# Patient Record
Sex: Male | Born: 1950 | Race: White | Hispanic: No | Marital: Married | Smoking: Former smoker
Health system: Southern US, Community
[De-identification: ages and names within clinical notes are randomized; demographics above are authoritative.]

## PROBLEM LIST (undated history)

## (undated) DIAGNOSIS — M5030 Other cervical disc degeneration, unspecified cervical region: Secondary | ICD-10-CM

## (undated) DIAGNOSIS — I2510 Atherosclerotic heart disease of native coronary artery without angina pectoris: Secondary | ICD-10-CM

## (undated) DIAGNOSIS — E785 Hyperlipidemia, unspecified: Secondary | ICD-10-CM

## (undated) DIAGNOSIS — H8110 Benign paroxysmal vertigo, unspecified ear: Secondary | ICD-10-CM

## (undated) DIAGNOSIS — K219 Gastro-esophageal reflux disease without esophagitis: Secondary | ICD-10-CM

## (undated) DIAGNOSIS — H9319 Tinnitus, unspecified ear: Secondary | ICD-10-CM

## (undated) HISTORY — DX: Atherosclerotic heart disease of native coronary artery without angina pectoris: I25.10

## (undated) HISTORY — DX: Hyperlipidemia, unspecified: E78.5

## (undated) HISTORY — DX: Benign paroxysmal vertigo, unspecified ear: H81.10

## (undated) HISTORY — DX: Other cervical disc degeneration, unspecified cervical region: M50.30

## (undated) HISTORY — DX: Tinnitus, unspecified ear: H93.19

## (undated) HISTORY — DX: Gastro-esophageal reflux disease without esophagitis: K21.9

## (undated) HISTORY — PX: CARDIAC CATHETERIZATION: SHX172

---

## 2002-06-20 ENCOUNTER — Encounter: Payer: Self-pay | Admitting: Otolaryngology

## 2002-06-20 ENCOUNTER — Encounter: Admission: RE | Admit: 2002-06-20 | Discharge: 2002-06-20 | Payer: Self-pay | Admitting: Otolaryngology

## 2005-07-16 ENCOUNTER — Encounter: Admission: RE | Admit: 2005-07-16 | Discharge: 2005-07-16 | Payer: Self-pay | Admitting: Family Medicine

## 2009-03-24 ENCOUNTER — Inpatient Hospital Stay (HOSPITAL_BASED_OUTPATIENT_CLINIC_OR_DEPARTMENT_OTHER): Admission: RE | Admit: 2009-03-24 | Discharge: 2009-03-24 | Payer: Self-pay | Admitting: Cardiology

## 2010-01-29 ENCOUNTER — Emergency Department (HOSPITAL_COMMUNITY): Admission: EM | Admit: 2010-01-29 | Discharge: 2010-01-29 | Payer: Self-pay | Admitting: Emergency Medicine

## 2010-10-11 HISTORY — PX: OTHER SURGICAL HISTORY: SHX169

## 2011-02-23 NOTE — Cardiovascular Report (Signed)
NAMEJAYMIN, [name] NO.:  [id number]   MEDICAL RECORD NO.:  [id number]          PATIENT TYPE:  OIB   LOCATION:  1963                         FACILITY:  [facility]   PHYSICIAN:  Armanda Magic, M.D.     DATE OF BIRTH:  Oct 19, 1950   DATE OF PROCEDURE:  03/24/2009  DATE OF DISCHARGE:  03/24/2009                            CARDIAC CATHETERIZATION   REFERRING PHYSICIAN:  Joycelyn Rua, MD   PROCEDURE:  Left heart catheterization, coronary angiography, and left  ventriculography.   OPERATOR:  Armanda Magic, MD   INDICATIONS:  Shortness of breath and abnormal EKG with abnormal nuclear  stress testing which showed a positive EKG for ischemia and a fixed  defect in the inferior wall, possibly due to diaphragmatic attenuation  since this was a fixed defect and there was no evidence of ischemia.  Because of shortness of breath and markedly abnormal EKG with stress, he  presents for cardiac catheterization.   The patient was brought to Cardiac Catheterization Laboratory in a  fasting nonsedated state.  Informed consent was obtained.  The patient  was connected to continuous heart rate, pulse oximetry monitoring, and  intermittent blood pressure monitoring.  The right groin was prepped and  draped in the sterile fashion.  Xylocaine 1% was used for local  anesthesia.  Using modified Seldinger technique, a 4-French sheath was  placed in the right femoral artery.  Under fluoroscopic guidance, a 4-  [place] JL-4 catheter was placed in the left coronary artery.  Multiple  cine films were taken at 30-degree RAO and 40-degree LAO views.  This  catheter was then exchanged out over a guidewire for a 4-French 3-DRC  catheter and was successfully engaged the right coronary ostium.  Multiple cine films were taken at 30-degree RAO and 40-degree LAO views.  This catheter was then exchanged out over a guidewire for a 4-French  angled pigtail catheter and was placed under fluoroscopic guidance  in  the left ventricular cavity.  Left ventriculography was performed in the  30-degree RAO view using a total of 30 mL contrast at 15 mL per second.  The catheter was then pulled back across the aortic valve with no  significant gradient noted.  At the end of the procedure, all catheters  and sheaths were removed.  Manual compression was performed until  adequate hemostasis was obtained.  The patient was transferred back to  operating room in stable condition.   RESULTS:  Left main coronary artery is widely patent and bifurcates to  left anterior descending artery and left circumflex artery.  The left  anterior descending artery is widely patent throughout its course of the  apex.  It gives rise to a very small first diagonal branch which is  patent and a second moderate-sized diagonal 2 which is patent as well.   The left circumflex is widely patent throughout its course in the AV  groove.  It gives rise to a very large obtuse marginal 1 branch which  appears to have a 50-60% narrowing in the ostium.  It then bifurcates  distally in the 2  daughter vessels both of which are widely patent.  The  ongoing left circumflex is widely patent giving rise to a small second  obtuse marginal branch which is widely patent.   The right coronary artery is a large vessel and is widely patent  throughout its course distally.  It bifurcates into posterior descending  artery and posterolateral artery both of which are widely patent.   Left ventriculography shows normal LV function, EF 60%, LV pressure  144/20 mmHg, aortic pressure 139/74 mmHg, LVEDP 23 mmHg.   ASSESSMENT:  1. One-vessel obstructive coronary artery disease of the first obtuse      marginal artery.1  2. Normal left ventricular function, ejection fraction 60%.  3. False positive stress test by EKG criteria.  4. Elevated left ventricular end-diastolic pressure consistent with      diastolic dysfunction.   PLAN:  Start aspirin 325 mg  daily.  We will discharge him to home after  IV fluid and bedrest is complete.  He will follow up with my nurse  practitioner in 2 weeks for groin check.      Armanda Magic, M.D.  Electronically Signed     TT/MEDQ  D:  03/24/2009  T:  03/25/2009  Job:  [id number]

## 2011-06-09 ENCOUNTER — Inpatient Hospital Stay (HOSPITAL_COMMUNITY)
Admission: EM | Admit: 2011-06-09 | Discharge: 2011-06-11 | DRG: 125 | Disposition: A | Discharge status: Home / Self Care | Payer: BC Managed Care – PPO | Attending: Cardiology | Admitting: Cardiology

## 2011-06-09 ENCOUNTER — Emergency Department (HOSPITAL_COMMUNITY): Payer: BC Managed Care – PPO

## 2011-06-09 DIAGNOSIS — E785 Hyperlipidemia, unspecified: Secondary | ICD-10-CM | POA: Diagnosis present

## 2011-06-09 DIAGNOSIS — I2510 Atherosclerotic heart disease of native coronary artery without angina pectoris: Principal | ICD-10-CM | POA: Diagnosis present

## 2011-06-09 LAB — CBC
HCT: 43 % (ref 39.0–52.0)
Hemoglobin: 15.8 g/dL (ref 13.0–17.0)
MCH: 31.3 pg (ref 26.0–34.0)
MCHC: 36.7 g/dL — ABNORMAL HIGH (ref 30.0–36.0)
MCV: 85.3 fL (ref 78.0–100.0)
Platelets: 161 10*3/uL (ref 150–400)
RBC: 5.04 MIL/uL (ref 4.22–5.81)
RDW: 12.8 % (ref 11.5–15.5)
WBC: 6.3 10*3/uL (ref 4.0–10.5)

## 2011-06-09 LAB — DIFFERENTIAL
Basophils Absolute: 0 10*3/uL (ref 0.0–0.1)
Basophils Relative: 1 % (ref 0–1)
Eosinophils Absolute: 0.2 10*3/uL (ref 0.0–0.7)
Eosinophils Relative: 3 % (ref 0–5)
Lymphocytes Relative: 26 % (ref 12–46)
Lymphs Abs: 1.6 10*3/uL (ref 0.7–4.0)
Monocytes Absolute: 0.6 10*3/uL (ref 0.1–1.0)
Monocytes Relative: 9 % (ref 3–12)
Neutro Abs: 3.9 10*3/uL (ref 1.7–7.7)
Neutrophils Relative %: 62 % (ref 43–77)

## 2011-06-09 LAB — POCT I-STAT, CHEM 8
BUN: 9 mg/dL (ref 6–23)
Calcium, Ion: 1.14 mmol/L (ref 1.12–1.32)
Chloride: 104 mEq/L (ref 96–112)
Creatinine, Ser: 0.8 mg/dL (ref 0.50–1.35)
Glucose, Bld: 109 mg/dL — ABNORMAL HIGH (ref 70–99)
HCT: 47 % (ref 39.0–52.0)
Hemoglobin: 16 g/dL (ref 13.0–17.0)
Potassium: 4 mEq/L (ref 3.5–5.1)
Sodium: 143 mEq/L (ref 135–145)
TCO2: 25 mmol/L (ref 0–100)

## 2011-06-09 LAB — CK TOTAL AND CKMB (NOT AT ARMC)
CK, MB: 2.1 ng/mL (ref 0.3–4.0)
Relative Index: INVALID (ref 0.0–2.5)
Total CK: 85 U/L (ref 7–232)

## 2011-06-09 LAB — PROTIME-INR
INR: 0.95 (ref 0.00–1.49)
Prothrombin Time: 12.9 seconds (ref 11.6–15.2)

## 2011-06-09 LAB — HEMOGLOBIN A1C
Hgb A1c MFr Bld: 5.2 % (ref <5.7)
Mean Plasma Glucose: 103 mg/dL (ref <117)

## 2011-06-09 LAB — POCT I-STAT TROPONIN I: Troponin i, poc: 0 ng/mL (ref 0.00–0.08)

## 2011-06-09 LAB — TSH: TSH: 1.154 u[IU]/mL (ref 0.350–4.500)

## 2011-06-09 LAB — CARDIAC PANEL(CRET KIN+CKTOT+MB+TROPI)
CK, MB: 1.9 ng/mL (ref 0.3–4.0)
Relative Index: INVALID (ref 0.0–2.5)
Total CK: 63 U/L (ref 7–232)
Troponin I: <0.3 ng/mL (ref <0.30)

## 2011-06-09 LAB — APTT: aPTT: 28 seconds (ref 24–37)

## 2011-06-09 LAB — TROPONIN I: Troponin I: <0.3 ng/mL (ref <0.30)

## 2011-06-10 ENCOUNTER — Observation Stay (HOSPITAL_COMMUNITY): Payer: BC Managed Care – PPO

## 2011-06-10 MED ORDER — TECHNETIUM TC 99M TETROFOSMIN IV KIT
30.0000 | PACK | Freq: Once | INTRAVENOUS | Status: AC | PRN
  Administered 2011-06-10: 30 via INTRAVENOUS

## 2011-06-10 MED ORDER — TECHNETIUM TC 99M TETROFOSMIN IV KIT
10.0000 | PACK | Freq: Once | INTRAVENOUS | Status: AC | PRN
  Administered 2011-06-10: 10 via INTRAVENOUS

## 2011-06-10 NOTE — H&P (Signed)
NAMEARIS, EVEN NO.:  [id number]  MEDICAL RECORD NO.:  [id number]  LOCATION:  [facility]                         FACILITY:  [facility]  PHYSICIAN:  Jake Bathe, MD      DATE OF BIRTH:  15-Sep-1951  DATE OF ADMISSION:  06/09/2011 DATE OF DISCHARGE:                             HISTORY & PHYSICAL   CARDIOLOGIST:  Armanda Magic, MD  PRIMARY PHYSICIAN:  Joycelyn Rua, MD  CHIEF COMPLAINT:  Chest pain.  HISTORY OF PRESENT ILLNESS:  [name] is a 60 year old male with a strong family history of coronary artery disease, hyperlipidemia and known coronary artery disease who had a cardiac catheterization in June 2010, which showed 50-60% ostial large obtuse marginal disease is here with chest pain.  He was in his usual state of health trying to exercise more over the past 6-8 weeks when earlier this morning was about to get out of bed, pulled his sheets up and felt sudden onset left-sided pinching chest pain that he described initially as 10/10 and somewhat took his breath away.  The pain was quite severe.  It may have been associated also with some left arm numbness and tingling.  The pain tended to decline but overall waxed and waned.  He was fairly concerned about this discomfort.  He tried different positions to see if this would relieve the pain and tilting his head back or moving his arms back did change the consistency of the pain somewhat but it did not relieve the discomfort together.  He also states that he had a dry mild cough earlier this morning but this has resolved.  He describes no recent fevers, no syncope.  He felt mild dizziness during this episode as well.  The chest discomfort finally subsided after nitroglycerin in the emergency department.  Currently, he is mildly anxious but feels better.  He states that he is obviously concerned about his heart given his strong family history.  His no male family member has lived past the age of 33 in the  recent years in his family he states and he recently turns 75.  As stated above, he did have a cardiac catheterization in June 2010, for what describes as a positive EKG for ischemia and a fixed defect in the inferior wall possible diaphragmatic attenuation.  No evidence of overt ischemia on the stress test.  He has also been working hard of his cholesterol and recently had a Wellness physical where his LDL was 76, HDL was low at 25 and his triglycerides were 105, usually they are quite elevated.  He has taken Crestor in the past but now is on Lipitor 40.  PAST MEDICAL HISTORY: 1. Hyperlipidemia strong family history as above. 2. Coronary artery disease in obtuse marginal branch from     catheterization as above.  ALLERGIES:  No known drug allergies.  MEDICATIONS: 1. Lipitor 40 once a day. 2. CoQ10 1 tablet a day. 3. Fish oil daily. 4. Aspirin 81 mg a day.  SOCIAL HISTORY:  Denies any tobacco use.  No drug use.  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  He is also an  Art gallery manager by training.  Married.  FAMILY HISTORY:  His mother died at age 96 from a myocardial infarction. He stated that she waited too long to go to the hospital.  His father died at age 52 from stomach cancer.  He has a brother at age 60 who had coronary artery disease status post PCI.  REVIEW OF SYSTEMS:  Denies any fevers, chills, nausea, vomiting, diaphoresis, bleeding, stroke-like symptoms.  Chest pain as described above, mild associated shortness of breath.  Unless specified above all other 12 review of systems negative.  PHYSICAL EXAMINATION:  VITAL SIGNS:  Blood pressure 121/80, pulse 66, respirations 20, satting 98% on room air, temperature is 98.9. GENERAL:  Alert and oriented x3, minimally anxious in no acute distress. EYES:  Well-perfused conjunctivae.  EOMI.  No scleral icterus. NECK:  Supple.  No lymphadenopathy.  No thyromegaly.  No carotid bruits. No  JVD. CARDIOVASCULAR:  Regular rate and rhythm without any appreciable murmurs, rubs or gallops.  Normal PMI. LUNGS:  Clear to auscultation bilaterally.  Normal respiratory effort. No wheezes.  No rales. MUSCULOSKELETAL:  Chest wall with no significant tenderness to palpation. SKIN:  Warm, dry, and intact.  No rashes noted across chest wall. GU:  Deferred. RECTAL:  Deferred. NEURO:  Nonfocal.  No signs of any stroke.  Cranial nerves II-XII grossly intact. ABDOMEN:  Soft, nontender, normoactive bowel sounds.  No bruits. EXTREMITIES:  Pulses are 2+ and equal in bilateral extremities.  DATA:  Cardiac enzymes x1 are negative at 8 a.m.  White count 6.3, hemoglobin 15.8, hematocrit 43.0, platelets 161.  BUN 9, creatinine 0.8, potassium 4.0.  EKG personally reviewed shows sinus rhythm rate 65 with nonspecific ST-T wave changes.  No other gross abnormalities are noted. Prior records as above.  ASSESSMENT/PLAN:  A 60 year old male with strong family history of coronary artery disease, obtuse marginal ostial 50-60% disease in 2010 catheterization, hyperlipidemia here with chest pain. 1. Chest pain - certainly has some atypical features to it and I     wonder if this is more of a musculoskeletal component especially     with the onset after moving his arms and some change in position.     EKG certainly is unremarkable, does not show any overt evidence of     acute pericarditis.  His white count is normal.  No fevers.  No     pleuritic chest pain or worsening pain with deep inspiration.     First set of markers are reassuring.  We will go and place him on     observation status and continue to cycle cardiac markers.  If     cardiac markers are positive or symptoms worsen or become more     worrisome or EKG changes, we will proceed with cardiac     catheterization.  For now, however if his markers are negative and     EKG remains negative, we will most likely proceed with stress     testing  which if markers and EKG are normal he can most likely have     as an outpatient.  I will allow Dr. Mayford Knife to make final     recommendation. 2. Hyperlipidemia - continue with Lipitor.  Fairly well controlled.     LDL goal is less than 70 with coronary artery disease.  He has had     some minor muscle aches with statins. 3. Family history of coronary artery disease - as above.     Jake Bathe, MD  MCS/MEDQ  D:  06/09/2011  T:  06/09/2011  Job:  [id number]  cc:   Joycelyn Rua, M.D. Armanda Magic, M.D.  Electronically Signed by Donato Schultz MD on 06/10/2011 06:19:15 AM

## 2011-06-11 LAB — BASIC METABOLIC PANEL
BUN: 14 mg/dL (ref 6–23)
CO2: 30 mEq/L (ref 19–32)
Calcium: 8.8 mg/dL (ref 8.4–10.5)
Chloride: 105 mEq/L (ref 96–112)
Creatinine, Ser: 0.81 mg/dL (ref 0.50–1.35)
GFR calc Af Amer: >60 mL/min (ref >60)
GFR calc non Af Amer: >60 mL/min (ref >60)
Glucose, Bld: 113 mg/dL — ABNORMAL HIGH (ref 70–99)
Potassium: 3.9 mEq/L (ref 3.5–5.1)
Sodium: 140 mEq/L (ref 135–145)

## 2011-07-07 NOTE — Discharge Summary (Signed)
  [name], ELLENWOOD NO.:  [id number]  MEDICAL RECORD NO.:  [id number]  LOCATION:  6522                         FACILITY:  [facility]  PHYSICIAN:  Corky Crafts, MDDATE OF BIRTH:  11/06/1950  DATE OF ADMISSION:  06/09/2011 DATE OF DISCHARGE:  06/11/2011                              DISCHARGE SUMMARY   FINAL DIAGNOSES: 1. Mild coronary artery disease. 2. Chest pain. 3. Hyperlipidemia.  PROCEDURES PERFORMED: 1. Cardiolite stress test on June 10, 2011, which showed normal     perfusion by images; however, the patient had angina and EKG     changes while exercising. 2. Cardiac catheterization on June 11, 2011 showing no significant     change from his prior catheterization in 2010.  He had a 50% ostial     OM1 lesion and 25% LAD lesion.  HOSPITAL COURSE:  The patient was admitted after having chest discomfort, ruled out for MI.  He underwent stress test as noted above. Subsequent catheterization showed no significant changes.  There is no need for angioplasty.  His cath was done from the right radial approach. He had a positive reverse Allen test.  There were no complications showing the possibility of microvascular disease.  If this is a cause of his pain, I think there is also a possibility that this was noncardiac chest pain when he described that some of the pain came on just with movement of his upper body.  He was not having any further discomfort at the time of discharge.  DISCHARGE MEDICATIONS: 1. Nitroglycerin sublingual p.r.n. chest pain. 2. Aspirin 325 mg daily. 3. CoQ10. 4. Fish oil 2 g daily. 5. Lipitor 40 mg daily.  Followup appointments with Dr. Mayford Knife on June 25, 2011 at 10:45 a.m.  ACTIVITY:  No lifting for 3 days.  Follow post radial cath instructions.  DIET:  Low-sodium heart-healthy diet.  SPECIAL INSTRUCTIONS:  He is to seek immediate attention if he has prolonged chest pain, not relieved with  nitroglycerin.     Corky Crafts, MD     JSV/MEDQ  D:  06/11/2011  T:  06/11/2011  Job:  [id number]  Electronically Signed by Lance Muss MD on 07/07/2011 01:09:24 PM

## 2011-07-07 NOTE — Cardiovascular Report (Signed)
  NAMESYLAS, TWOMBLY NO.:  [id number]  MEDICAL RECORD NO.:  [id number]  LOCATION:  6522                         FACILITY:  [facility]  PHYSICIAN:  Corky Crafts, MDDATE OF BIRTH:  [birth year]  DATE OF PROCEDURE:  06/11/2011 DATE OF DISCHARGE:  06/11/2011                           CARDIAC CATHETERIZATION   PROCEDURE PERFORMED:  Left heart catheterization, left ventriculogram, coronary angiogram.  OPERATOR:  Corky Crafts, MD  INDICATIONS:  Unstable angina.  PRIMARY CARDIOLOGIST:  Armanda Magic, MD  PROCEDURE NARRATIVE:  The risks and benefits of cardiac catheterization were explained to the patient and informed consent obtained.  He was brought to the cath lab.  He was prepped and draped in usual sterile fashion.  His right wrist was infiltrated with 1% lidocaine.  A 5-French sheath was placed into the right radial artery using the modified Seldinger technique.  Right coronary artery angiography was performed using a JR-4.0 catheter.  The catheter was advanced into the vessel ostium under fluoroscopic guidance.  Digital angiography was performed in multiple projections using hand injection of contrast.  Left coronary artery angiography was performed using a JL-4.0 catheter in a similar fashion.  Pigtail catheter was advanced to the ascending aorta and across the aortic valve under fluoroscopic guidance.  Power injection of contrast was performed in the RAO projection to image the left ventricle.  The catheter was pulled back under continuous hemodynamic pressure monitoring.  The sheath was removed and a TR band was used for hemostasis.  FINDINGS:  Right coronary artery is a large dominant vessel, which appears angiographically normal.  There is a PDA and posterolateral artery, both of which appear widely patent. Left main was widely patent. Left circumflex is a large vessel and widely patent.  At the ostium of the OM-1, there is a 40-50% lesion.   The remainder of the circumflex system was widely patent. LAD is a large vessel.  There is a 25% midvessel stenosis.  The first and second diagonals were small but widely patent.  The third diagonal is medium-sized and patent. Left ventriculogram showed normal ventricular function.  HEMODYNAMICS:  Left ventricle pressure 120/2 with an LVEDP of 8 mmHg. Aortic pressure 117/61 with a mean aortic pressure of 85 mmHg.  IMPRESSION:  Mild coronary artery disease involving the ostium of the OM- 1 and mid LAD.  There has been no significant change from catheterization in 2010.  RECOMMENDATIONS:  Continue aggressive medical therapy.  He will likely be able to be discharged later today.     Corky Crafts, MD     JSV/MEDQ  D:  06/11/2011  T:  06/11/2011  Job:  [id number]  Electronically Signed by Lance Muss MD on 07/07/2011 01:09:36 PM

## 2013-07-24 ENCOUNTER — Other Ambulatory Visit: Payer: Self-pay | Admitting: Cardiology

## 2013-12-06 ENCOUNTER — Ambulatory Visit: Payer: BC Managed Care – PPO | Admitting: Cardiology

## 2013-12-16 ENCOUNTER — Other Ambulatory Visit: Payer: Self-pay | Admitting: Cardiology

## 2014-01-03 ENCOUNTER — Encounter: Payer: Self-pay | Admitting: General Surgery

## 2014-01-03 DIAGNOSIS — E7800 Pure hypercholesterolemia, unspecified: Secondary | ICD-10-CM | POA: Insufficient documentation

## 2014-01-03 DIAGNOSIS — I2510 Atherosclerotic heart disease of native coronary artery without angina pectoris: Secondary | ICD-10-CM

## 2014-01-14 ENCOUNTER — Ambulatory Visit (INDEPENDENT_AMBULATORY_CARE_PROVIDER_SITE_OTHER): Payer: BC Managed Care – PPO | Admitting: Cardiology

## 2014-01-14 ENCOUNTER — Encounter: Payer: Self-pay | Admitting: Cardiology

## 2014-01-14 VITALS — BP 163/75 | HR 58 | Ht 70.0 in | Wt 188.2 lb

## 2014-01-14 DIAGNOSIS — E7800 Pure hypercholesterolemia, unspecified: Secondary | ICD-10-CM

## 2014-01-14 DIAGNOSIS — I2510 Atherosclerotic heart disease of native coronary artery without angina pectoris: Secondary | ICD-10-CM

## 2014-01-14 LAB — LIPID PANEL
Cholesterol: 188 mg/dL (ref 0–200)
HDL: 32 mg/dL — ABNORMAL LOW (ref >39.00)
LDL Cholesterol: 124 mg/dL — ABNORMAL HIGH (ref 0–99)
Total CHOL/HDL Ratio: 6
Triglycerides: 160 mg/dL — ABNORMAL HIGH (ref 0.0–149.0)
VLDL: 32 mg/dL (ref 0.0–40.0)

## 2014-01-14 LAB — ALT: ALT: 32 U/L (ref 0–53)

## 2014-01-14 NOTE — Progress Notes (Addendum)
  [address] [place], [place]  [address] Phone: [phone] Fax:  [phone]  Date:  01/14/2014   ID:  [name], DOB 07/23/1951, MRN [id number]  PCP:  Joycelyn RuaMEYERS, STEPHEN, MD  Cardiologist:  Armanda Magicraci Turner, MD     History of Present Illness: [name] is a 63 y.o. male with a history of nonobstructive ASCAD and dyslipidemia who presents today for followup.  He is doing well.  He denies any chest pain, SOB, DOE, LE edema, dizziness, palpitations or syncope.   Wt Readings from Last 3 Encounters:  01/14/14 188 lb 3.2 oz (85.367 kg)     Past Medical History  Diagnosis Date  . GERD (gastroesophageal reflux disease)   . Tinnitus   . BPV (benign positional vertigo)   . Hyperlipidemia   . DDD (degenerative disc disease), cervical   . Coronary artery disease     nonobstructive  40-45% OM1 and 25% mid LAD by cath 2012    Current Outpatient Prescriptions  Medication Sig Dispense Refill  . Omega-3 Fatty Acids (FISH OIL) 1200 MG CAPS Take 2 capsules by mouth daily.      Marland Kitchen. aspirin EC 81 MG tablet Take 81 mg by mouth daily.      Marland Kitchen. atorvastatin (LIPITOR) 80 MG tablet Take 1 tablet (80 mg total) by mouth daily at 6 PM.  90 tablet  0  . Multiple Vitamin (MULTIVITAMIN) tablet Take 1 tablet by mouth daily.      . nitroGLYCERIN (NITROSTAT) 0.4 MG SL tablet Place 0.4 mg under the tongue every 5 (five) minutes as needed for chest pain.       No current facility-administered medications for this visit.    Allergies:   No Known Allergies  Social History:  The patient  reports that he has quit smoking. He does not have any smokeless tobacco history on file. He reports that he drinks alcohol. He reports that he does not use illicit drugs.   Family History:  The patient's family history includes Heart attack in his mother; Heart disease in his mother; Stomach cancer in his father.   ROS:  Please see the history of present illness.      All other systems reviewed and negative.    PHYSICAL EXAM: VS:  BP 163/75  Pulse 58  Ht 5\' 10"  (1.778 m)  Wt 188 lb 3.2 oz (85.367 kg)  BMI 27.00 kg/m2 Well nourished, well developed, in no acute distress HEENT: normal Neck: no JVD Cardiac:  normal S1, S2; RRR; no murmur Lungs:  clear to auscultation bilaterally, no wheezing, rhonchi or rales Abd: soft, nontender, no hepatomegaly Ext: no edema Skin: warm and dry Neuro:  CNs 2-12 intact, no focal abnormalities noted  EKG:  NSR with nonspecific ST abnormality  ASSESSMENT AND PLAN:  1. Nonobstructive ASCAD 2. Dyslipidemia - continue Atorvastatin and fish oil - check fasting lipid panel and ALT 3.  Elevated BP with no history of HTN - I have asked him to check his BP 2-3 times this next week and call with the results  Followup with me in 1 year  Signed, Armanda Magicraci Turner, MD 01/14/2014 8:19 AM

## 2014-01-14 NOTE — Patient Instructions (Signed)
Your physician recommends that you continue on your current medications as directed. Please refer to the Current Medication list given to you today.  Lab today: Lipid, Alt  Monitor your blood pressure 2-3 times this week and call with readings. [phone]  Your physician wants you to follow-up in: 1 year You will receive a reminder letter in the mail two months in advance. If you don't receive a letter, please call our office to schedule the follow-up appointment.

## 2014-01-29 ENCOUNTER — Telehealth: Payer: Self-pay | Admitting: Cardiology

## 2014-01-29 NOTE — Telephone Encounter (Signed)
Good BP readings - continue current meds 

## 2014-01-29 NOTE — Telephone Encounter (Signed)
New message     Want recent test results

## 2014-01-29 NOTE — Telephone Encounter (Signed)
Pt called to get previous lab results from last OV (please see result notes for documentation). Pt also called to give BP readings that he was instructed to obtain per Dr Mayford Knifeurner  BP READINGS-    4/7-   A.m. 126/73     P.m  142/68  4/8- p.m. 131/74  4/9-  A.m. 137/77     P.m. 135/69  4/12- a.m.  127/77

## 2014-01-29 NOTE — Telephone Encounter (Signed)
Returned call to patient Dr.Turner advised B/P readings look good continue same medications.

## 2014-01-30 ENCOUNTER — Encounter: Payer: Self-pay | Admitting: General Surgery

## 2014-01-30 ENCOUNTER — Other Ambulatory Visit: Payer: Self-pay | Admitting: General Surgery

## 2014-01-30 DIAGNOSIS — E7800 Pure hypercholesterolemia, unspecified: Secondary | ICD-10-CM

## 2014-03-17 ENCOUNTER — Other Ambulatory Visit: Payer: Self-pay | Admitting: Cardiology

## 2014-05-31 ENCOUNTER — Other Ambulatory Visit (INDEPENDENT_AMBULATORY_CARE_PROVIDER_SITE_OTHER): Payer: BC Managed Care – PPO

## 2014-05-31 DIAGNOSIS — E7800 Pure hypercholesterolemia, unspecified: Secondary | ICD-10-CM

## 2014-05-31 LAB — HEPATIC FUNCTION PANEL
ALT: 43 U/L (ref 0–53)
AST: 29 U/L (ref 0–37)
Albumin: 4 g/dL (ref 3.5–5.2)
Alkaline Phosphatase: 85 U/L (ref 39–117)
BILIRUBIN TOTAL: 1.1 mg/dL (ref 0.2–1.2)
Bilirubin, Direct: 0 mg/dL (ref 0.0–0.3)
TOTAL PROTEIN: 6.6 g/dL (ref 6.0–8.3)

## 2014-05-31 LAB — LIPID PANEL
CHOLESTEROL: 133 mg/dL (ref 0–200)
HDL: 33.6 mg/dL — ABNORMAL LOW (ref >39.00)
LDL Cholesterol: 73 mg/dL (ref 0–99)
NonHDL: 99.4
TRIGLYCERIDES: 134 mg/dL (ref 0.0–149.0)
Total CHOL/HDL Ratio: 4
VLDL: 26.8 mg/dL (ref 0.0–40.0)

## 2014-06-03 ENCOUNTER — Other Ambulatory Visit: Payer: Self-pay | Admitting: General Surgery

## 2014-06-03 DIAGNOSIS — E7800 Pure hypercholesterolemia, unspecified: Secondary | ICD-10-CM

## 2014-10-30 ENCOUNTER — Encounter: Payer: Self-pay | Admitting: Cardiology

## 2014-12-04 ENCOUNTER — Other Ambulatory Visit: Payer: BC Managed Care – PPO

## 2014-12-24 ENCOUNTER — Other Ambulatory Visit (INDEPENDENT_AMBULATORY_CARE_PROVIDER_SITE_OTHER): Payer: BLUE CROSS/BLUE SHIELD | Admitting: *Deleted

## 2014-12-24 DIAGNOSIS — E7800 Pure hypercholesterolemia, unspecified: Secondary | ICD-10-CM

## 2014-12-24 LAB — HEPATIC FUNCTION PANEL
ALT: 36 U/L (ref 0–53)
AST: 20 U/L (ref 0–37)
Albumin: 4.4 g/dL (ref 3.5–5.2)
Alkaline Phosphatase: 96 U/L (ref 39–117)
Bilirubin, Direct: 0.1 mg/dL (ref 0.0–0.3)
TOTAL PROTEIN: 6.8 g/dL (ref 6.0–8.3)
Total Bilirubin: 0.9 mg/dL (ref 0.2–1.2)

## 2014-12-24 LAB — LIPID PANEL
Cholesterol: 162 mg/dL (ref 0–200)
HDL: 29.9 mg/dL — AB (ref >39.00)
NonHDL: 132.1
TRIGLYCERIDES: 205 mg/dL — AB (ref 0.0–149.0)
Total CHOL/HDL Ratio: 5
VLDL: 41 mg/dL — ABNORMAL HIGH (ref 0.0–40.0)

## 2014-12-24 LAB — LDL CHOLESTEROL, DIRECT: LDL DIRECT: 93 mg/dL

## 2014-12-27 ENCOUNTER — Telehealth: Payer: Self-pay | Admitting: Cardiology

## 2014-12-27 DIAGNOSIS — E7800 Pure hypercholesterolemia, unspecified: Secondary | ICD-10-CM

## 2014-12-27 MED ORDER — EZETIMIBE 10 MG PO TABS: 10.0000 mg | ORAL_TABLET | Freq: Every day | ORAL | Status: DC

## 2014-12-27 NOTE — Telephone Encounter (Signed)
New Msg ° ° ° ° ° ° ° °Pt returning call from yesterday. ° ° ° °Please return pt call.  °

## 2014-12-27 NOTE — Telephone Encounter (Signed)
-----   Message from Quintella Reichertraci R Turner, MD sent at 12/24/2014  3:41 PM EDT ----- Add Zetia 10mg  daily and recheck FLP and ALT in 8 weeks

## 2014-12-27 NOTE — Telephone Encounter (Signed)
Informed patient of results and verbal understanding expressed.  Instructed patient to START ZETIA 10 mg daily. Labs scheduled in 8 weeks.  Patient agrees with treatment plan.

## 2015-01-24 ENCOUNTER — Encounter: Payer: Self-pay | Admitting: Cardiology

## 2015-01-24 ENCOUNTER — Ambulatory Visit (INDEPENDENT_AMBULATORY_CARE_PROVIDER_SITE_OTHER): Payer: BLUE CROSS/BLUE SHIELD | Admitting: Cardiology

## 2015-01-24 VITALS — BP 146/76 | HR 61 | Ht 70.0 in | Wt 194.0 lb

## 2015-01-24 DIAGNOSIS — E7800 Pure hypercholesterolemia, unspecified: Secondary | ICD-10-CM

## 2015-01-24 DIAGNOSIS — I2583 Coronary atherosclerosis due to lipid rich plaque: Secondary | ICD-10-CM

## 2015-01-24 DIAGNOSIS — I10 Essential (primary) hypertension: Secondary | ICD-10-CM

## 2015-01-24 DIAGNOSIS — I2510 Atherosclerotic heart disease of native coronary artery without angina pectoris: Secondary | ICD-10-CM

## 2015-01-24 DIAGNOSIS — R079 Chest pain, unspecified: Secondary | ICD-10-CM | POA: Insufficient documentation

## 2015-01-24 MED ORDER — AMLODIPINE BESYLATE 5 MG PO TABS: 5.0000 mg | ORAL_TABLET | Freq: Every day | ORAL | Status: DC

## 2015-01-24 NOTE — Progress Notes (Addendum)
Cardiology Office Note   Date:  01/24/2015   ID:  [name], DOB 05/11/1951, MRN [id number]  PCP:  Joycelyn RuaMEYERS, STEPHEN, MD    Chief Complaint  Patient presents with  . Coronary Artery Disease  . Hypertension  . Chest Pain      History of Present Illness: [name] is a 64 y.o. male with a history of nonobstructive ASCAD and dyslipidemia who presents today for followup. He is doing well. He says that about a month ago he started having some diaphoresis and head pressure and his BP was 168/12200mmHg.  He says that his left arm was aching and then went to his jaw.  He went to the [place] in [place] and he had a chemical stress test and chest CT both of which were normal. Since then he has not had any chest pain.  He denies any LE edema, dizziness, palpitations or syncope.  He does get DOE when walking up several flights of stairs.  He also has noticed fatigue as well as bilateral knee pain.     Past Medical History  Diagnosis Date  . GERD (gastroesophageal reflux disease)   . Tinnitus   . BPV (benign positional vertigo)   . Hyperlipidemia   . DDD (degenerative disc disease), cervical   . Coronary artery disease     nonobstructive  40-45% OM1 and 25% mid LAD by cath 2012    Past Surgical History  Procedure Laterality Date  . Cardiac catheterization      non-obstructive CAD LVEF 60% 03/24/09, cath 40-45% OM1 and 25% mid LAD in 2012  . Vitreal detachment  2012     Current Outpatient Prescriptions  Medication Sig Dispense Refill  . aspirin EC 81 MG tablet Take 81 mg by mouth daily.    Marland Kitchen. atorvastatin (LIPITOR) 80 MG tablet TAKE 1 TABLET DAILY AT 6 P.M 90 tablet 3  . ezetimibe (ZETIA) 10 MG tablet Take 1 tablet (10 mg total) by mouth daily. 30 tablet 6  . lisinopril (PRINIVIL,ZESTRIL) 20 MG tablet Take 20 mg by mouth. Only takes if bp is 160/90    . Multiple Vitamin (MULTIVITAMIN) tablet Take 1 tablet by mouth daily.    Marland Kitchen. NITROSTAT 0.4 MG SL tablet DISSOLVE 1 TABLET UNDER  THE TONGUE AS DIRECTED AND AS NEEDED FOR CHEST PAIN 25 tablet 4  . Omega-3 Fatty Acids (FISH OIL) 1200 MG CAPS Take 2 capsules by mouth daily.    Marland Kitchen. amLODipine (NORVASC) 5 MG tablet Take 1 tablet (5 mg total) by mouth daily. 30 tablet 3   No current facility-administered medications for this visit.    Allergies:   Review of patient's allergies indicates no known allergies.    Social History:  The patient  reports that he quit smoking about 36 years ago. He does not have any smokeless tobacco history on file. He reports that he drinks alcohol. He reports that he does not use illicit drugs.   Family History:  The patient's family history includes Heart attack in his mother; Heart disease in his mother; Stomach cancer in his father.    ROS:  Please see the history of present illness.   Otherwise, review of systems are positive for none.   All other systems are reviewed and negative.    PHYSICAL EXAM: VS:  BP 146/76 mmHg  Pulse 61  Ht 5\' 10"  (1.778 m)  Wt 194 lb (87.998 kg)  BMI 27.84 kg/m2  SpO2 96% , BMI Body mass index  is 27.84 kg/(m^2). GEN: Well nourished, well developed, in no acute distress HEENT: normal Neck: no JVD, carotid bruits, or masses Cardiac: RRR; no murmurs, rubs, or gallops,no edema  Respiratory:  clear to auscultation bilaterally, normal work of breathing GI: soft, nontender, nondistended, + BS MS: no deformity or atrophy Skin: warm and dry, no rash Neuro:  Strength and sensation are intact Psych: euthymic mood, full affect   EKG:  EKG is not ordered today.    Recent Labs: 12/24/2014: ALT 36    Lipid Panel    Component Value Date/Time   CHOL 162 12/24/2014 0739   TRIG 205.0* 12/24/2014 0739   HDL 29.90* 12/24/2014 0739   CHOLHDL 5 12/24/2014 0739   VLDL 41.0* 12/24/2014 0739   LDLCALC 73 05/31/2014 0739   LDLDIRECT 93.0 12/24/2014 0739      Wt Readings from Last 3 Encounters:  01/24/15 194 lb (87.998 kg)  01/14/14 188 lb 3.2 oz (85.367 kg)       ASSESSMENT AND PLAN: 1.  Nonobstructive ASCAD with recent episode of chest pain that I suspect was due to poorly controlled HTN.  He had a normal stress myoview. I will check a 2D echo to asses LVF.  He needs aggressive control of HTN.  Will add amlodipine.  Avoid BB due to borderline low HR. 2 . Dyslipidemia - continue Atorvastatin/Zetia and fish oil.  Zetia just added a few weeks ago for LDL that was not at goal - recheck FLP and ALT in 6 weeks 3.  HTN - poorly controlled.  Will add amlodipine  daily.  I have asked him to check his BP daily for a week and call with results.     Current medicines are reviewed at length with the patient today.  The patient does not have concerns regarding medicines.  The following changes have been made:  Start Amlodipine  daily  Labs/ tests ordered today include: see above assessment and plan  Orders Placed This Encounter  Procedures  . 2D Echocardiogram without contrast     Disposition:   FU with me  in 6 months   Signed, Quintella Reichert, MD  01/24/2015 4:33 PM    [facility] [address] [place], [place], [place]  [address] Phone: [phone]; Fax: [phone]

## 2015-01-24 NOTE — Patient Instructions (Addendum)
Medication Instructions:  Your physician has recommended you make the following change in your medication:  1) START AMLODIPINE 5 mg daily  Labwork: None  Testing/Procedures: Your physician has requested that you have an echocardiogram. Echocardiography is a painless test that uses sound waves to create images of your heart. It provides your doctor with information about the size and shape of your heart and how well your heart's chambers and valves are working. This procedure takes approximately one hour. There are no restrictions for this procedure.  Follow-Up: Your physician wants you to follow-up in: 6 months with Dr. Mayford Knifeurner. You will receive a reminder letter in the mail two months in advance. If you don't receive a letter, please call our office to schedule the follow-up appointment.   Any Other Special Instructions Will Be Listed Below (If Applicable). Check your blood pressure daily for one week and call us with results.

## 2015-01-24 NOTE — Addendum Note (Signed)
Addended by: Armanda MagicURNER, TRACI R on: 01/24/2015 11:17 PM   Modules accepted: Kipp BroodSmartSet

## 2015-01-31 ENCOUNTER — Ambulatory Visit (HOSPITAL_COMMUNITY): Payer: BLUE CROSS/BLUE SHIELD | Attending: Cardiology | Admitting: Radiology

## 2015-01-31 DIAGNOSIS — I10 Essential (primary) hypertension: Secondary | ICD-10-CM | POA: Diagnosis not present

## 2015-01-31 DIAGNOSIS — R079 Chest pain, unspecified: Secondary | ICD-10-CM | POA: Insufficient documentation

## 2015-01-31 DIAGNOSIS — E785 Hyperlipidemia, unspecified: Secondary | ICD-10-CM | POA: Diagnosis not present

## 2015-01-31 NOTE — Progress Notes (Signed)
Echocardiogram performed.  

## 2015-02-25 ENCOUNTER — Other Ambulatory Visit (INDEPENDENT_AMBULATORY_CARE_PROVIDER_SITE_OTHER): Payer: BLUE CROSS/BLUE SHIELD | Admitting: *Deleted

## 2015-02-25 DIAGNOSIS — E7800 Pure hypercholesterolemia, unspecified: Secondary | ICD-10-CM

## 2015-02-25 LAB — LIPID PANEL
CHOLESTEROL: 109 mg/dL (ref 0–200)
HDL: 31.6 mg/dL — ABNORMAL LOW (ref >39.00)
LDL CALC: 60 mg/dL (ref 0–99)
NONHDL: 77.4
Total CHOL/HDL Ratio: 3
Triglycerides: 86 mg/dL (ref 0.0–149.0)
VLDL: 17.2 mg/dL (ref 0.0–40.0)

## 2015-02-25 LAB — ALT: ALT: 44 U/L (ref 0–53)

## 2015-03-11 ENCOUNTER — Other Ambulatory Visit: Payer: Self-pay | Admitting: Cardiology

## 2015-03-31 ENCOUNTER — Other Ambulatory Visit: Payer: Self-pay | Admitting: Cardiology

## 2015-04-01 ENCOUNTER — Other Ambulatory Visit: Payer: Self-pay | Admitting: Cardiology

## 2015-04-01 MED ORDER — EZETIMIBE 10 MG PO TABS: 10.0000 mg | ORAL_TABLET | Freq: Every day | ORAL | Status: DC

## 2015-04-01 MED ORDER — AMLODIPINE BESYLATE 5 MG PO TABS: 5.0000 mg | ORAL_TABLET | Freq: Every day | ORAL | Status: DC

## 2015-05-15 ENCOUNTER — Encounter: Payer: Self-pay | Admitting: Cardiology

## 2015-06-12 ENCOUNTER — Other Ambulatory Visit: Payer: Self-pay | Admitting: Cardiology

## 2015-07-11 ENCOUNTER — Telehealth: Payer: Self-pay | Admitting: Cardiology

## 2015-07-11 DIAGNOSIS — E7800 Pure hypercholesterolemia, unspecified: Secondary | ICD-10-CM

## 2015-07-11 NOTE — Telephone Encounter (Signed)
New Message  Pt wanted to know if he needed lab work prior to his appt on 10/20 w/ Dr Mayford Knife. Please call back and discuss.

## 2015-07-12 NOTE — Telephone Encounter (Signed)
Needs FLP and ALT 

## 2015-07-15 NOTE — Telephone Encounter (Signed)
FLP and ALT scheduled October 17. Patient agrees with treatment plan.

## 2015-07-28 ENCOUNTER — Other Ambulatory Visit (INDEPENDENT_AMBULATORY_CARE_PROVIDER_SITE_OTHER): Payer: BLUE CROSS/BLUE SHIELD | Admitting: *Deleted

## 2015-07-28 DIAGNOSIS — E7800 Pure hypercholesterolemia, unspecified: Secondary | ICD-10-CM | POA: Diagnosis not present

## 2015-07-28 LAB — HEPATIC FUNCTION PANEL
ALT: 39 U/L (ref 9–46)
AST: 26 U/L (ref 10–35)
Albumin: 4.2 g/dL (ref 3.6–5.1)
Alkaline Phosphatase: 77 U/L (ref 40–115)
BILIRUBIN DIRECT: 0.3 mg/dL — AB (ref <=0.2)
BILIRUBIN TOTAL: 1.1 mg/dL (ref 0.2–1.2)
Indirect Bilirubin: 0.8 mg/dL (ref 0.2–1.2)
Total Protein: 6.6 g/dL (ref 6.1–8.1)

## 2015-07-28 LAB — LIPID PANEL
CHOL/HDL RATIO: 3.5 ratio (ref <=5.0)
Cholesterol: 105 mg/dL — ABNORMAL LOW (ref 125–200)
HDL: 30 mg/dL — ABNORMAL LOW (ref >=40)
LDL Cholesterol: 60 mg/dL (ref <130)
Triglycerides: 74 mg/dL (ref <150)
VLDL: 15 mg/dL (ref <30)

## 2015-07-28 NOTE — Addendum Note (Signed)
Addended by: Tonita PhoenixBOWDEN, Ailen Strauch K on: 07/28/2015 07:57 AM   Modules accepted: Orders

## 2015-07-28 NOTE — Addendum Note (Signed)
Addended by: Kynesha Guerin K on: 07/28/2015 07:58 AM   Modules accepted: Orders  

## 2015-07-28 NOTE — Addendum Note (Signed)
Addended by: Tonita PhoenixBOWDEN, Hamad Whyte K on: 07/28/2015 07:58 AM   Modules accepted: Orders

## 2015-07-30 NOTE — Progress Notes (Signed)
Cardiology Office Note   Date:  07/31/2015   ID:  [name], DOB 01/10/1951, MRN [id number]  PCP:  Joycelyn RuaMEYERS, STEPHEN, MD    Chief Complaint  Patient presents with  . Coronary Artery Disease  . Hypertension  . Hyperlipidemia      History of Present Illness: [name] is a 64 y.o. male with a history of nonobstructive ASCAD and dyslipidemia who presents today for followup.Stress test and echo earlier this year were normal.   He is doing well. He denies any LE edema, palpitations, CP, SOB or syncope.    Past Medical History  Diagnosis Date  . GERD (gastroesophageal reflux disease)   . Tinnitus   . BPV (benign positional vertigo)   . Hyperlipidemia   . DDD (degenerative disc disease), cervical   . Coronary artery disease     nonobstructive  40-45% OM1 and 25% mid LAD by cath 2012    Past Surgical History  Procedure Laterality Date  . Cardiac catheterization      non-obstructive CAD LVEF 60% 03/24/09, cath 40-45% OM1 and 25% mid LAD in 2012  . Vitreal detachment  2012     Current Outpatient Prescriptions  Medication Sig Dispense Refill  . amLODipine (NORVASC) 5 MG tablet Take 1 tablet (5 mg total) by mouth daily. 90 tablet 1  . aspirin EC 81 MG tablet Take 81 mg by mouth daily.    Marland Kitchen. atorvastatin (LIPITOR) 80 MG tablet TAKE 1 TABLET DAILY AT 6 P.M. 90 tablet 0  . ezetimibe (ZETIA) 10 MG tablet Take 1 tablet (10 mg total) by mouth daily. 90 tablet 1  . lisinopril (PRINIVIL,ZESTRIL) 20 MG tablet Take 20 mg by mouth. Only takes if bp is 160/90    . Multiple Vitamin (MULTIVITAMIN) tablet Take 1 tablet by mouth daily.    Marland Kitchen. NITROSTAT 0.4 MG SL tablet DISSOLVE 1 TABLET UNDER THE TONGUE AS DIRECTED AND AS NEEDED FOR CHEST PAIN 25 tablet 3  . Omega-3 Fatty Acids (FISH OIL) 1200 MG CAPS Take 2 capsules by mouth daily.     No current facility-administered medications for this visit.    Allergies:   Review of patient's allergies indicates no known  allergies.    Social History:  The patient  reports that he quit smoking about 36 years ago. He does not have any smokeless tobacco history on file. He reports that he drinks alcohol. He reports that he does not use illicit drugs.   Family History:  The patient's family history includes Heart attack in his mother; Heart disease in his mother; Stomach cancer in his father.    ROS:  Please see the history of present illness.   Otherwise, review of systems are positive for none.   All other systems are reviewed and negative.    PHYSICAL EXAM: VS:  BP 120/70 mmHg  Pulse 57  Ht 5\' 10"  (1.778 m)  Wt 194 lb 6.4 oz (88.179 kg)  BMI 27.89 kg/m2 , BMI Body mass index is 27.89 kg/(m^2). GEN: Well nourished, well developed, in no acute distress HEENT: normal Neck: no JVD, carotid bruits, or masses Cardiac: RRR; no murmurs, rubs, or gallops,no edema  Respiratory:  clear to auscultation bilaterally, normal work of breathing GI: soft, nontender, nondistended, + BS MS: no deformity or atrophy Skin: warm and dry, no rash Neuro:  Strength and sensation are intact Psych: euthymic mood, full affect  EKG:  EKG  ordered today showing sinus bradycardia with nonspecific ST abnormality    Recent Labs: 07/28/2015: ALT 39    Lipid Panel    Component Value Date/Time   CHOL 105* 07/28/2015 0758   TRIG 74 07/28/2015 0758   HDL 30* 07/28/2015 0758   CHOLHDL 3.5 07/28/2015 0758   VLDL 15 07/28/2015 0758   LDLCALC 60 07/28/2015 0758   LDLDIRECT 93.0 12/24/2014 0739      Wt Readings from Last 3 Encounters:  07/31/15 194 lb 6.4 oz (88.179 kg)  01/24/15 194 lb (87.998 kg)  01/14/14 188 lb 3.2 oz (85.367 kg)    ASSESSMENT AND PLAN: 1. Nonobstructive ASCAD.  Continue ASA 2 . Dyslipidemia - LDL at goal at 60 - continue Atorvastatin/Zetia and fish oil.  - I have encouraged him to get into a walking routine 3. HTN - controlled. Continue amlodipine/ACE I PRN.Avoid BB due to borderline low  HR.  Current medicines are reviewed at length with the patient today.  The patient does not have concerns regarding medicines.  The following changes have been made:  no change  Labs/ tests ordered today: See above Assessment and Plan No orders of the defined types were placed in this encounter.     Disposition:   FU with me in 1 year  Signed, Quintella Reichert, MD  07/31/2015 3:09 PM    [facility] [address] [place], [place], [place]  [address] Phone: [phone]; Fax: [phone]

## 2015-07-31 ENCOUNTER — Ambulatory Visit (INDEPENDENT_AMBULATORY_CARE_PROVIDER_SITE_OTHER): Payer: BLUE CROSS/BLUE SHIELD | Admitting: Cardiology

## 2015-07-31 ENCOUNTER — Encounter: Payer: Self-pay | Admitting: Cardiology

## 2015-07-31 VITALS — BP 120/70 | HR 57 | Ht 70.0 in | Wt 194.4 lb

## 2015-07-31 DIAGNOSIS — E7800 Pure hypercholesterolemia, unspecified: Secondary | ICD-10-CM

## 2015-07-31 DIAGNOSIS — I10 Essential (primary) hypertension: Secondary | ICD-10-CM

## 2015-07-31 DIAGNOSIS — I2510 Atherosclerotic heart disease of native coronary artery without angina pectoris: Secondary | ICD-10-CM | POA: Diagnosis not present

## 2015-07-31 DIAGNOSIS — I2583 Coronary atherosclerosis due to lipid rich plaque: Principal | ICD-10-CM

## 2015-07-31 NOTE — Patient Instructions (Signed)

## 2015-09-08 ENCOUNTER — Other Ambulatory Visit: Payer: Self-pay | Admitting: Cardiology

## 2015-09-09 ENCOUNTER — Other Ambulatory Visit: Payer: Self-pay | Admitting: Cardiology

## 2016-06-05 ENCOUNTER — Other Ambulatory Visit: Payer: Self-pay | Admitting: Cardiology

## 2016-07-16 ENCOUNTER — Telehealth: Payer: Self-pay | Admitting: Cardiology

## 2016-07-16 DIAGNOSIS — E7800 Pure hypercholesterolemia, unspecified: Secondary | ICD-10-CM

## 2016-07-16 NOTE — Telephone Encounter (Signed)
New message    Pt calling to have a fasting lab order put in the system so he can make a appt prior to his appt with Dr. Mayford Knifeurner. Please call.

## 2016-07-18 NOTE — Telephone Encounter (Signed)
Please get FLP and ALT

## 2016-07-20 NOTE — Telephone Encounter (Signed)
Scheduled patient Tuesday, November 28 for fasting blood work. He agrees with treatment plan.

## 2016-08-19 ENCOUNTER — Other Ambulatory Visit: Payer: Self-pay | Admitting: Cardiology

## 2016-09-07 ENCOUNTER — Other Ambulatory Visit: Payer: BLUE CROSS/BLUE SHIELD

## 2016-09-09 ENCOUNTER — Ambulatory Visit: Payer: BLUE CROSS/BLUE SHIELD | Admitting: Cardiology

## 2016-10-08 ENCOUNTER — Other Ambulatory Visit: Payer: Self-pay | Admitting: Cardiology

## 2016-10-18 ENCOUNTER — Other Ambulatory Visit: Payer: Self-pay | Admitting: Cardiology

## 2016-11-04 ENCOUNTER — Ambulatory Visit: Payer: BLUE CROSS/BLUE SHIELD | Admitting: Cardiology

## 2016-11-04 ENCOUNTER — Other Ambulatory Visit: Payer: Self-pay | Admitting: Cardiology

## 2016-11-25 ENCOUNTER — Ambulatory Visit (INDEPENDENT_AMBULATORY_CARE_PROVIDER_SITE_OTHER): Payer: Managed Care, Other (non HMO) | Admitting: Cardiology

## 2016-11-25 ENCOUNTER — Encounter: Payer: Self-pay | Admitting: Cardiology

## 2016-11-25 VITALS — BP 136/68 | HR 65 | Ht 70.0 in | Wt 191.0 lb

## 2016-11-25 DIAGNOSIS — I10 Essential (primary) hypertension: Secondary | ICD-10-CM

## 2016-11-25 DIAGNOSIS — E7800 Pure hypercholesterolemia, unspecified: Secondary | ICD-10-CM

## 2016-11-25 DIAGNOSIS — I2510 Atherosclerotic heart disease of native coronary artery without angina pectoris: Secondary | ICD-10-CM

## 2016-11-25 NOTE — Progress Notes (Signed)
Cardiology Office Note    Date:  11/25/2016   ID:  [name], DOB 04/25/1951, MRN [id number]  PCP:  Joycelyn RuaMEYERS, STEPHEN, MD  Cardiologist:  Armanda Magicraci Terrilyn Tyner, MD   Chief Complaint  Patient presents with  . Coronary Artery Disease  . Hypertension  . Hyperlipidemia    History of Present Illness:  [name] is a 66 y.o. male with a history of nonobstructive ASCAD, HTN and dyslipidemia who presents today for followup.  He is doing well. He denies any LE edema, palpitations, CP, SOB, DOE (except with climbing stairs), PND, orthopnea or syncope. He complains of getting dizziness when looking up and it triggered vertigo.  His main concern that he has been very tired recently and no energy to do anything.    Past Medical History:  Diagnosis Date  . BPV (benign positional vertigo)   . Coronary artery disease    nonobstructive  40-45% OM1 and 25% mid LAD by cath 2012  . DDD (degenerative disc disease), cervical   . GERD (gastroesophageal reflux disease)   . Hyperlipidemia   . Tinnitus     Past Surgical History:  Procedure Laterality Date  . CARDIAC CATHETERIZATION     non-obstructive CAD LVEF 60% 03/24/09, cath 40-45% OM1 and 25% mid LAD in 2012  . vitreal detachment  2012    Current Medications: Current Meds  Medication Sig  . amLODipine (NORVASC) 5 MG tablet TAKE 1 TABLET DAILY  . aspirin EC 81 MG tablet Take 81 mg by mouth daily.  Marland Kitchen. atorvastatin (LIPITOR) 80 MG tablet Take 1 tablet (80 mg total) by mouth daily at 6 PM. Please keep 11/04/16 appt for futher refills.  . ezetimibe (ZETIA) 10 MG tablet Take 1 tablet (10 mg total) by mouth daily.  . Multiple Vitamin (MULTIVITAMIN) tablet Take 1 tablet by mouth daily.  . nitroGLYCERIN (NITROSTAT) 0.4 MG SL tablet DISSOLVE 1 TABLET UNDER THE TONGUE AS DIRECTED AND AS NEEDED FOR CHEST PAIN  . Omega-3 Fatty Acids (FISH OIL) 1200 MG CAPS Take 2 capsules by mouth daily.  . tamsulosin (FLOMAX) 0.4 MG CAPS capsule Take 1 capsule by mouth  daily. Take 30 minutes after dinner daily    Allergies:   Patient has no known allergies.   Social History   Social History  . Marital status: Married    Spouse name: N/A  . Number of children: N/A  . Years of education: N/A   Social History Main Topics  . Smoking status: Former Smoker    Quit date: 01/23/1979  . Smokeless tobacco: Never Used     Comment: quit in 1980s  . Alcohol use Yes     Comment: moderate  . Drug use: No  . Sexual activity: Not Asked   Other Topics Concern  . None   Social History Narrative  . None     Family History:  The patient's family history includes Heart attack in his mother; Heart disease in his mother; Stomach cancer in his father.   ROS:   Please see the history of present illness.    ROS All other systems reviewed and are negative.  No flowsheet data found.     PHYSICAL EXAM:   VS:  BP 136/68   Pulse 65   Ht 5\' 10"  (1.778 m)   Wt 191 lb (86.6 kg)   BMI 27.41 kg/m    GEN: Well nourished, well developed, in no acute distress  HEENT: normal  Neck: no JVD, carotid bruits, or  masses Cardiac: RRR; no murmurs, rubs, or gallops,no edema.  Intact distal pulses bilaterally.  Respiratory:  clear to auscultation bilaterally, normal work of breathing GI: soft, nontender, nondistended, + BS MS: no deformity or atrophy  Skin: warm and dry, no rash Neuro:  Alert and Oriented x 3, Strength and sensation are intact Psych: euthymic mood, full affect  Wt Readings from Last 3 Encounters:  11/25/16 191 lb (86.6 kg)  07/31/15 194 lb 6.4 oz (88.2 kg)  01/24/15 194 lb (88 kg)      Studies/Labs Reviewed:   EKG:  EKG is ordered today and showed NSR at 65bpm with nonspecific ST abnormality  Recent Labs: No results found for requested labs within last 8760 hours.   Lipid Panel    Component Value Date/Time   CHOL 105 (L) 07/28/2015 0758   TRIG 74 07/28/2015 0758   HDL 30 (L) 07/28/2015 0758   CHOLHDL 3.5 07/28/2015 0758   VLDL 15  07/28/2015 0758   LDLCALC 60 07/28/2015 0758   LDLDIRECT 93.0 12/24/2014 0739    Additional studies/ records that were reviewed today include:  none    ASSESSMENT:    1. Coronary artery disease involving native coronary artery of native heart without angina pectoris   2. Benign essential HTN   3. Pure hypercholesterolemia      PLAN:  In order of problems listed above:  1. ASCAD - nonobstructive by cath 2012.  He has no anginal symptoms. He will continue on ASA and statin.  2. HTN - BP controlled on current meds.  He will continue on amlodipine/ACE I.  BMET was normal. 3.   Hyperlipidemia with LDL goal < 70.  He will continue on statin and zetia.  His LDL was 61 in 08/23/2016.  Medication Adjustments/Labs and Tests Ordered: Current medicines are reviewed at length with the patient today.  Concerns regarding medicines are outlined above.  Medication changes, Labs and Tests ordered today are listed in the Patient Instructions below.  There are no Patient Instructions on file for this visit.   Signed, Armanda Magic, MD  11/25/2016 2:30 PM    [facility] [address] [place], [place], [place]  [address] Phone: [phone]; Fax: [phone]

## 2016-11-25 NOTE — Patient Instructions (Signed)

## 2017-02-03 ENCOUNTER — Other Ambulatory Visit: Payer: Self-pay | Admitting: Cardiology

## 2017-03-17 ENCOUNTER — Other Ambulatory Visit: Payer: Self-pay | Admitting: *Deleted

## 2017-03-17 MED ORDER — EZETIMIBE 10 MG PO TABS: 10.0000 mg | ORAL_TABLET | Freq: Every day | ORAL | 2 refills | Status: DC

## 2017-03-21 ENCOUNTER — Other Ambulatory Visit: Payer: Self-pay | Admitting: *Deleted

## 2017-03-21 MED ORDER — ATORVASTATIN CALCIUM 80 MG PO TABS: 80.0000 mg | ORAL_TABLET | Freq: Every day | ORAL | 2 refills | Status: DC

## 2017-10-17 ENCOUNTER — Other Ambulatory Visit: Payer: Self-pay | Admitting: Cardiology

## 2017-11-16 ENCOUNTER — Other Ambulatory Visit: Payer: Self-pay | Admitting: Cardiology

## 2017-12-01 ENCOUNTER — Other Ambulatory Visit: Payer: Self-pay | Admitting: Cardiology

## 2017-12-03 ENCOUNTER — Other Ambulatory Visit: Payer: Self-pay | Admitting: Cardiology

## 2017-12-12 ENCOUNTER — Other Ambulatory Visit: Payer: Self-pay | Admitting: Cardiology

## 2017-12-12 MED ORDER — ATORVASTATIN CALCIUM 80 MG PO TABS: 80.0000 mg | ORAL_TABLET | Freq: Every day | ORAL | 0 refills | Status: DC

## 2018-01-08 NOTE — Progress Notes (Signed)
\ Cardiology Office Note:    Date:  01/09/2018   ID:  [name], DOB 04/24/1951, MRN [id number]  PCP:  Joycelyn Rua, MD  Cardiologist:  No primary care provider on file.    Referring MD: Joycelyn Rua, MD   Chief Complaint  Patient presents with  . Coronary Artery Disease  . Hypertension  . Hyperlipidemia    History of Present Illness:    [name] is a 67 y.o. male with a hx of nonobstructive ASCAD (40-45% OM1 and 25% mLAD by cath in 2012), HTN and dyslipidemia.  He is here today for followup and is doing well.  He denies any chest pain or pressure, SOB, DOE (except with climbing stairs), PND, orthopnea, LE edema, dizziness, palpitations or syncope. He is compliant with his meds and is tolerating meds with no SE.    Past Medical History:  Diagnosis Date  . BPV (benign positional vertigo)   . Coronary artery disease    nonobstructive  40-45% OM1 and 25% mid LAD by cath 2012  . DDD (degenerative disc disease), cervical   . GERD (gastroesophageal reflux disease)   . Hyperlipidemia   . Tinnitus     Past Surgical History:  Procedure Laterality Date  . CARDIAC CATHETERIZATION     non-obstructive CAD LVEF 60% 03/24/09, cath 40-45% OM1 and 25% mid LAD in 2012  . vitreal detachment  2012    Current Medications: Current Meds  Medication Sig  . amLODipine (NORVASC) 5 MG tablet Take 1 tablet (5 mg total) by mouth daily. Please keep upcoming appt for future refills. Thank you  . aspirin EC 81 MG tablet Take 81 mg by mouth daily.  Marland Kitchen atorvastatin (LIPITOR) 80 MG tablet Take 1 tablet (80 mg total) by mouth daily at 6 PM. Please keep upcoming appt for future refills. Thank you  . ezetimibe (ZETIA) 10 MG tablet Take 1 tablet (10 mg total) by mouth daily. Please keep upcoming appt for future refills. Thank you  . lisinopril (PRINIVIL,ZESTRIL) 20 MG tablet Take 20 mg by mouth. Only takes if bp is 160/90  . Multiple Vitamin (MULTIVITAMIN) tablet Take 1 tablet by mouth daily.  .  Omega-3 Fatty Acids (FISH OIL) 1200 MG CAPS Take 2 capsules by mouth daily.  . tamsulosin (FLOMAX) 0.4 MG CAPS capsule Take 1 capsule by mouth daily. Take 30 minutes after dinner daily  . [DISCONTINUED] amLODipine (NORVASC) 5 MG tablet Take 1 tablet (5 mg total) by mouth daily. Please keep upcoming appt for future refills. Thank you  . [DISCONTINUED] nitroGLYCERIN (NITROSTAT) 0.4 MG SL tablet DISSOLVE 1 TABLET UNDER THE TONGUE AS DIRECTED AND AS NEEDED FOR CHEST PAIN     Allergies:   Patient has no known allergies.   Social History   Socioeconomic History  . Marital status: Married    Spouse name: Not on file  . Number of children: Not on file  . Years of education: Not on file  . Highest education level: Not on file  Occupational History  . Not on file  Social Needs  . Financial resource strain: Not on file  . Food insecurity:    Worry: Not on file    Inability: Not on file  . Transportation needs:    Medical: Not on file    Non-medical: Not on file  Tobacco Use  . Smoking status: Former Smoker    Last attempt to quit: 01/23/1979    Years since quitting: 38.9  . Smokeless tobacco: Never Used  .  Tobacco comment: quit in 1980s  Substance and Sexual Activity  . Alcohol use: Yes    Comment: moderate  . Drug use: No  . Sexual activity: Not on file  Lifestyle  . Physical activity:    Days per week: Not on file    Minutes per session: Not on file  . Stress: Not on file  Relationships  . Social connections:    Talks on phone: Not on file    Gets together: Not on file    Attends religious service: Not on file    Active member of club or organization: Not on file    Attends meetings of clubs or organizations: Not on file    Relationship status: Not on file  Other Topics Concern  . Not on file  Social History Narrative  . Not on file     Family History: The patient's family history includes Heart attack in his mother; Heart disease in his mother; Stomach cancer in his  father.  ROS:   Please see the history of present illness.    ROS  All other systems reviewed and negative.   EKGs/Labs/Other Studies Reviewed:    The following studies were reviewed today: none  EKG:  EKG is  ordered today.  The ekg ordered today demonstrates NSR at 60bpm with nonspecific ST abnormality  Recent Labs: No results found for requested labs within last 8760 hours.   Recent Lipid Panel    Component Value Date/Time   CHOL 105 (L) 07/28/2015 0758   TRIG 74 07/28/2015 0758   HDL 30 (L) 07/28/2015 0758   CHOLHDL 3.5 07/28/2015 0758   VLDL 15 07/28/2015 0758   LDLCALC 60 07/28/2015 0758   LDLDIRECT 93.0 12/24/2014 0739    Physical Exam:    VS:  BP 136/72   Pulse 60   Ht 5\' 10"  (1.778 m)   Wt 181 lb (82.1 kg)   SpO2 95%   BMI 25.97 kg/m     Wt Readings from Last 3 Encounters:  01/09/18 181 lb (82.1 kg)  11/25/16 191 lb (86.6 kg)  07/31/15 194 lb 6.4 oz (88.2 kg)     GEN:  Well nourished, well developed in no acute distress HEENT: Normal NECK: No JVD; No carotid bruits LYMPHATICS: No lymphadenopathy CARDIAC: RRR, no murmurs, rubs, gallops RESPIRATORY:  Clear to auscultation without rales, wheezing or rhonchi  ABDOMEN: Soft, non-tender, non-distended MUSCULOSKELETAL:  No edema; No deformity  SKIN: Warm and dry NEUROLOGIC:  Alert and oriented x 3 PSYCHIATRIC:  Normal affect   ASSESSMENT:    1. Coronary artery disease involving native coronary artery of native heart without angina pectoris   2. Benign essential HTN   3. Pure hypercholesterolemia    PLAN:    In order of problems listed above:  1.  ASCAD - s/p cath with 40-45% OM1 and 25% mLAD by cath in 2012.  He denies any anginal sx. He will continue on ASA 81mg  daily and statin.  2.  HTN - BP well controlled on exam today.  He will continue on amlodipine 5mg  daily and Lisinopril 20mg  daily.  3.  Hyperlipidemia with LDL goal < 70.  He will continue on Atorvastatin 80mg  daily and Zetia 10mg   daily.      Medication Adjustments/Labs and Tests Ordered: Current medicines are reviewed at length with the patient today.  Concerns regarding medicines are outlined above.  Orders Placed This Encounter  Procedures  . EKG 12-Lead   Meds ordered this encounter  Medications  . amLODipine (NORVASC) 5 MG tablet    Sig: Take 1 tablet (5 mg total) by mouth daily. Please keep upcoming appt for future refills. Thank you    Dispense:  30 tablet    Refill:  1    TAKE 1 TABLET DAILY. MAKE A YEARLY APPOINTMENT WITH DR. Mayford Knife FOR FEBRUARY BEFORE ANYMORE REFILLS. SECOND ATTEMPT    Signed, Armanda Magic, MD  01/09/2018 8:21 AM    [facility]

## 2018-01-09 ENCOUNTER — Encounter: Payer: Self-pay | Admitting: Cardiology

## 2018-01-09 ENCOUNTER — Ambulatory Visit: Payer: Managed Care, Other (non HMO) | Admitting: Cardiology

## 2018-01-09 VITALS — BP 136/72 | HR 60 | Ht 70.0 in | Wt 181.0 lb

## 2018-01-09 DIAGNOSIS — E7800 Pure hypercholesterolemia, unspecified: Secondary | ICD-10-CM

## 2018-01-09 DIAGNOSIS — I10 Essential (primary) hypertension: Secondary | ICD-10-CM

## 2018-01-09 DIAGNOSIS — I2510 Atherosclerotic heart disease of native coronary artery without angina pectoris: Secondary | ICD-10-CM | POA: Diagnosis not present

## 2018-01-09 MED ORDER — AMLODIPINE BESYLATE 5 MG PO TABS: 5.0000 mg | ORAL_TABLET | Freq: Every day | ORAL | 1 refills | Status: DC

## 2018-01-09 NOTE — Patient Instructions (Signed)
Medication Instructions:  Your physician recommends that you continue on your current medications as directed. Please refer to the Current Medication list given to you today.  If you need a refill on your cardiac medications, please contact your pharmacy first.  Labwork: None ordered   Testing/Procedures: None ordered   Follow-Up: Your physician wants you to follow-up in: 1 year with Dr. Turner. You will receive a reminder letter in the mail two months in advance. If you don't receive a letter, please call our office to schedule the follow-up appointment.  Any Other Special Instructions Will Be Listed Below (If Applicable).   Thank you for choosing [facility]    Rena Ellianah Cordy, RN  [phone]  If you need a refill on your cardiac medications before your next appointment, please call your pharmacy.   

## 2018-01-30 ENCOUNTER — Other Ambulatory Visit: Payer: Self-pay | Admitting: Cardiology

## 2018-01-31 ENCOUNTER — Other Ambulatory Visit: Payer: Self-pay

## 2018-01-31 MED ORDER — AMLODIPINE BESYLATE 5 MG PO TABS: 5.0000 mg | ORAL_TABLET | Freq: Every day | ORAL | 3 refills | Status: DC

## 2018-03-01 ENCOUNTER — Other Ambulatory Visit: Payer: Self-pay | Admitting: Cardiology

## 2018-05-15 ENCOUNTER — Other Ambulatory Visit: Payer: Self-pay | Admitting: Cardiology

## 2018-05-15 MED ORDER — ATORVASTATIN CALCIUM 80 MG PO TABS: 80.0000 mg | ORAL_TABLET | Freq: Every day | ORAL | 2 refills | Status: DC

## 2018-05-15 MED ORDER — AMLODIPINE BESYLATE 5 MG PO TABS: 5.0000 mg | ORAL_TABLET | Freq: Every day | ORAL | 2 refills | Status: DC

## 2018-05-15 MED ORDER — EZETIMIBE 10 MG PO TABS: 10.0000 mg | ORAL_TABLET | Freq: Every day | ORAL | 2 refills | Status: DC

## 2019-01-19 ENCOUNTER — Telehealth: Payer: Self-pay | Admitting: Cardiology

## 2019-01-19 NOTE — Telephone Encounter (Signed)
Video- Webex/Phone Consent for TelepHealth visit/ Please call his cell# [phone]

## 2019-01-22 NOTE — Progress Notes (Signed)
Virtual Visit via Video Note   This visit type was conducted due to national recommendations for restrictions regarding the COVID-19 Pandemic (e.g. social distancing) in an effort to limit this patient's exposure and mitigate transmission in our community.  Due to his co-morbid illnesses, this patient is at least at moderate risk for complications without adequate follow up.  This format is felt to be most appropriate for this patient at this time.  All issues noted in this document were discussed and addressed.  A limited physical exam was performed with this format.  Please refer to the patient's chart for his consent to telehealth [facility].  Evaluation Performed:  Follow-up visit  This visit type was conducted due to national recommendations for restrictions regarding the COVID-19 Pandemic (e.g. social distancing).  This format is felt to be most appropriate for this patient at this time.  All issues noted in this document were discussed and addressed.  No physical exam was performed (except for noted visual exam findings with Video Visits).  Please refer to the patient's chart (MyChart message for video visits and phone note for telephone visits) for the patient's consent to telehealth [facility].  Date:  01/23/2019   ID:  [name], DOB 08/13/1951, MRN [id number]  Patient Location:  Home  [place]   [place]  PCP:  Joycelyn RuaMeyers, Stephen, MD  Cardiologist:  Armanda Magicraci Jianna Drabik, MD Electrophysiologist:  None   Chief Complaint:  CAD, HTN, hyperlipidemia  History of Present Illness:    [name] is a 68 y.o. male who presents via audio/video conferencing for a telehealth visit today.    [name] is a 68 y.o. male with a hx of nonobstructive ASCAD (40-45% OM1 and 25% mLAD by cath in 2012), HTNand dyslipidemia. He is here today for followup and is doing well.  He denies any chest pain or pressure, SOB, DOE, PND, orthopnea, LE edema, dizziness, palpitations or  syncope. He is compliant with his meds and is tolerating meds with no SE.    The patient does not have symptoms concerning for COVID-19 infection (fever, chills, cough, or new shortness of breath).    Prior CV studies:   The following studies were reviewed today:  none  Past Medical History:  Diagnosis Date   BPV (benign positional vertigo)    Coronary artery disease    nonobstructive  40-45% OM1 and 25% mid LAD by cath 2012   DDD (degenerative disc disease), cervical    GERD (gastroesophageal reflux disease)    Hyperlipidemia    Tinnitus    Past Surgical History:  Procedure Laterality Date   CARDIAC CATHETERIZATION     non-obstructive CAD LVEF 60% 03/24/09, cath 40-45% OM1 and 25% mid LAD in 2012   vitreal detachment  2012     Current Meds  Medication Sig   amLODipine (NORVASC) 5 MG tablet Take 1 tablet (5 mg total) by mouth daily.   aspirin EC 81 MG tablet Take 81 mg by mouth daily.   atorvastatin (LIPITOR) 80 MG tablet Take 1 tablet (80 mg total) by mouth daily at 6 PM.   ezetimibe (ZETIA) 10 MG tablet Take 1 tablet (10 mg total) by mouth daily.   ibuprofen (ADVIL,MOTRIN) 800 MG tablet Take 800 mg by mouth every 8 (eight) hours as needed for mild pain.   lisinopril (PRINIVIL,ZESTRIL) 10 MG tablet Take 10 mg by mouth daily.   tamsulosin (FLOMAX) 0.4 MG CAPS capsule Take 1 capsule by mouth daily.  Take 30 minutes after dinner daily   [DISCONTINUED] lisinopril (PRINIVIL,ZESTRIL) 20 MG tablet Take 10 mg by mouth daily. Only takes if bp is 160/90      Allergies:   Patient has no known allergies.   Social History   Tobacco Use   Smoking status: Former Smoker    Last attempt to quit: 01/23/1979    Years since quitting: 40.0   Smokeless tobacco: Never Used   Tobacco comment: quit in 1980s  Substance Use Topics   Alcohol use: Yes    Comment: moderate   Drug use: No     Family Hx: The patient's family history includes Heart attack in his mother;  Heart disease in his mother; Stomach cancer in his father.  ROS:   Please see the history of present illness.     All other systems reviewed and are negative.   Labs/Other Tests and Data Reviewed:    Recent Labs: No results found for requested labs within last 8760 hours.   Recent Lipid Panel Lab Results  Component Value Date/Time   CHOL 105 (L) 07/28/2015 07:58 AM   TRIG 74 07/28/2015 07:58 AM   HDL 30 (L) 07/28/2015 07:58 AM   CHOLHDL 3.5 07/28/2015 07:58 AM   LDLCALC 60 07/28/2015 07:58 AM   LDLDIRECT 93.0 12/24/2014 07:39 AM    Wt Readings from Last 3 Encounters:  01/23/19 180 lb (81.6 kg)  01/09/18 181 lb (82.1 kg)  11/25/16 191 lb (86.6 kg)     Objective:    Vital Signs:  BP 125/69    Pulse 65    Ht 5\' 10"  (1.778 m)    Wt 180 lb (81.6 kg)    BMI 25.83 kg/m    CONSTITUTIONAL:  Well nourished, well developed male in no acute distress.  EYES: anicteric MOUTH: oral mucosa is pink RESPIRATORY: Normal respiratory effort, symmetric expansion CARDIOVASCULAR: No peripheral edema SKIN: multiple erythematous oozing papules on the arms MUSCULOSKELETAL: no digital cyanosis NEURO: Cranial Nerves II-XII grossly intact, moves all extremities PSYCH: Intact judgement and insight.  A&O x 3, Mood/affect appropriate   ASSESSMENT & PLAN:    1.  ASCAD - he has a h/o nonobstructive ASCAD with 40-45% OM1 and 25% mLAD by cath in 2012.  He has not had any anginal sx since I saw him last.  He will continue on ASA 81mg  daily and statin.   2.  HTN - he checks his BP at home and states that it has been controlled.  He will continue on amlodipine 5mg  daily and Lisinopril 20mg  daily.  His creatinine was stable at 0.86 on 08/2018.  3.  Hyperlipidemia -  His LDL goal is < 70.  His LDL was 83 on 09/01/2018.  I will repeat an FLP and ALT in July when COVID19 crisis has resolved.  He will continue on Zetia 10mg  daily and Atorvastatin 80mg  daily.   4.  Poison ivy - he was up working on his  property mowing and clearing weeds and got into poison ivy or poison oak and now has multiple papular erythematous weeping pustules on his arms, back and groin area that are minimally responding to corticosteroid ointment.  I wlil prescribe a Medrol dose pack.  I instructed him to call his PCP if it does not respond.   5.  Back pain - this has been going on for several years and was felt to be musculoskeletal by his PCP but has gotten more noticeable lately.  I have recommended that he  make a Webex appt with his PCP to discuss further.     6.  COVID-19 Education: The signs and symptoms of COVID-19 were discussed with the patient and how to seek care for testing (follow up with PCP or arrange E-visit).  The importance of social distancing was discussed today.  Patient Risk:   After full review of this patient's clinical status, I feel that they are at least moderate risk at this time.  Time:   Today, I have spent 25 minutes with the patient reviewing chart and discussing medical problems including CAD, HTN and hyperlipidemia and reviewing symptoms of COVID 19 and the ways to protect against contracting the virus with telehealth technology.      Medication Adjustments/Labs and Tests Ordered: Current medicines are reviewed at length with the patient today.  Concerns regarding medicines are outlined above.  Tests Ordered: No orders of the defined types were placed in this encounter.  Medication Changes: No orders of the defined types were placed in this encounter.   Disposition:  Follow up in 1 year(s)  Signed, Armanda Magic, MD  01/23/2019 8:07 AM    [facility]

## 2019-01-22 NOTE — Telephone Encounter (Signed)
TELEPHONE CALL NOTE  [name] has been deemed a candidate for a follow-up tele-health visit to limit community exposure during the Covid-19 pandemic. I spoke with the patient via phone to ensure availability of phone/video source, confirm preferred email & phone number, and discuss instructions and expectations.  I reminded [name] to be prepared with any vital sign and/or heart rhythm information that could potentially be obtained via home monitoring, at the time of his visit. I reminded [name] to expect a phone call at the time of his visit if his visit.  Did the patient verbally acknowledge consent to treatment? Yes, per Jasmine DecemberSharon on 01/19/19  Dustin FlockBen G Taeko Schaffer, RN 01/22/2019 4:03 PM   DOWNLOADING THE WEBEX SOFTWARE TO SMARTPHONE  - If Apple, go to [employer] Store and type in WebEx in the search bar. Download [employer] Meetings, the blue/green circle. The app is free but as with any other app downloads, their phone may require them to verify saved payment information or Apple password. The patient does NOT have to create an account.  - If Android, ask patient to go to [employer] Play Store and type in WebEx in the search bar. Download [employer] Meetings, the blue/green circle. The app is free but as with any other app downloads, their phone may require them to verify saved payment information or Android password. The patient does NOT have to create an account.   CONSENT FOR TELE-HEALTH VISIT - PLEASE REVIEW  I hereby voluntarily request, consent and authorize [facility] and its employed or contracted physicians, physician assistants, nurse practitioners or other licensed health care professionals (the Practitioner), to provide me with telemedicine health care services (the "Services") as deemed necessary by the treating Practitioner. I acknowledge and consent to receive the Services by the Practitioner via telemedicine. I understand that the telemedicine visit will involve  communicating with the Practitioner through live audiovisual communication technology and the disclosure of certain medical information by electronic transmission. I acknowledge that I have been given the opportunity to request an in-person assessment or other available alternative prior to the telemedicine visit and am voluntarily participating in the telemedicine visit.  I understand that I have the right to withhold or withdraw my consent to the use of telemedicine in the course of my care at any time, without affecting my right to future care or treatment, and that the Practitioner or I may terminate the telemedicine visit at any time. I understand that I have the right to inspect all information obtained and/or recorded in the course of the telemedicine visit and may receive copies of available information for a reasonable fee.  I understand that some of the potential risks of receiving the Services via telemedicine include:  Marland Kitchen. Delay or interruption in medical evaluation due to technological equipment failure or disruption; . Information transmitted may not be sufficient (e.g. poor resolution of images) to allow for appropriate medical decision making by the Practitioner; and/or  . In rare instances, security protocols could fail, causing a breach of personal health information.  Furthermore, I acknowledge that it is my responsibility to provide information about my medical history, conditions and care that is complete and accurate to the [place] of my ability. I acknowledge that Practitioner's advice, recommendations, and/or decision may be based on factors not within their control, such as incomplete or inaccurate data provided by me or distortions of diagnostic images or specimens that may result from electronic transmissions. I understand that the practice of  medicine is not an Visual merchandiser and that Practitioner makes no warranties or guarantees regarding treatment outcomes. I acknowledge that I will  receive a copy of this consent concurrently upon execution via email to the email address I last provided but may also request a printed copy by calling the office of [facility].    I understand that my insurance will be billed for this visit.   I have read or had this consent read to me. . I understand the contents of this consent, which adequately explains the benefits and risks of the Services being provided via telemedicine.  . I have been provided ample opportunity to ask questions regarding this consent and the Services and have had my questions answered to my satisfaction. . I give my informed consent for the services to be provided through the use of telemedicine in my medical care  By participating in this telemedicine visit I agree to the above.

## 2019-01-23 ENCOUNTER — Encounter: Payer: Self-pay | Admitting: Cardiology

## 2019-01-23 ENCOUNTER — Other Ambulatory Visit: Payer: Self-pay

## 2019-01-23 ENCOUNTER — Other Ambulatory Visit: Payer: Self-pay | Admitting: Cardiology

## 2019-01-23 ENCOUNTER — Telehealth (INDEPENDENT_AMBULATORY_CARE_PROVIDER_SITE_OTHER): Payer: BLUE CROSS/BLUE SHIELD | Admitting: Cardiology

## 2019-01-23 VITALS — BP 125/69 | HR 65 | Ht 70.0 in | Wt 180.0 lb

## 2019-01-23 DIAGNOSIS — M549 Dorsalgia, unspecified: Secondary | ICD-10-CM | POA: Diagnosis not present

## 2019-01-23 DIAGNOSIS — G8929 Other chronic pain: Secondary | ICD-10-CM

## 2019-01-23 DIAGNOSIS — L237 Allergic contact dermatitis due to plants, except food: Secondary | ICD-10-CM

## 2019-01-23 DIAGNOSIS — Z7189 Other specified counseling: Secondary | ICD-10-CM

## 2019-01-23 DIAGNOSIS — I10 Essential (primary) hypertension: Secondary | ICD-10-CM | POA: Diagnosis not present

## 2019-01-23 DIAGNOSIS — E7800 Pure hypercholesterolemia, unspecified: Secondary | ICD-10-CM

## 2019-01-23 DIAGNOSIS — I2510 Atherosclerotic heart disease of native coronary artery without angina pectoris: Secondary | ICD-10-CM | POA: Diagnosis not present

## 2019-01-23 MED ORDER — METHYLPREDNISOLONE 4 MG PO TBPK: ORAL_TABLET | ORAL | 0 refills | Status: DC

## 2019-01-23 NOTE — Patient Instructions (Signed)
Medication Instructions:  Start: Medrol Dose pack for Poison Ivy  If you need a refill on your cardiac medications before your next appointment, please call your pharmacy.   Lab work: Fasting Labs: 04/16/19 If you have labs (blood work) drawn today and your tests are completely normal, you will receive your results only by: Marland Kitchen MyChart Message (if you have MyChart) OR . A paper copy in the mail If you have any lab test that is abnormal or we need to change your treatment, we will call you to review the results.  Testing/Procedures: None  Follow-Up: At [facility], you and your health needs are our priority.  As part of our continuing mission to provide you with exceptional heart care, we have created designated Provider Care Teams.  These Care Teams include your primary Cardiologist (physician) and Advanced Practice Providers (APPs -  Physician Assistants and Nurse Practitioners) who all work together to provide you with the care you need, when you need it. You will need a follow up appointment in 1 years.  Please call our office 2 months in advance to schedule this appointment.  You may see Dr. Mayford Knife or one of the following Advanced Practice Providers on your designated Care Team:   [place], PA-C Ronie Spies, PA-C . Jacolyn Reedy, PA-C

## 2019-02-09 ENCOUNTER — Other Ambulatory Visit: Payer: Self-pay | Admitting: Cardiology

## 2019-04-16 ENCOUNTER — Other Ambulatory Visit: Payer: Self-pay

## 2019-04-16 ENCOUNTER — Other Ambulatory Visit: Payer: BC Managed Care – PPO | Admitting: *Deleted

## 2019-04-16 DIAGNOSIS — E7800 Pure hypercholesterolemia, unspecified: Secondary | ICD-10-CM

## 2019-04-16 LAB — HEPATIC FUNCTION PANEL
ALT: 40 IU/L (ref 0–44)
AST: 28 IU/L (ref 0–40)
Albumin: 4.7 g/dL (ref 3.8–4.8)
Alkaline Phosphatase: 98 IU/L (ref 39–117)
Bilirubin Total: 0.9 mg/dL (ref 0.0–1.2)
Bilirubin, Direct: 0.23 mg/dL (ref 0.00–0.40)
Total Protein: 6.6 g/dL (ref 6.0–8.5)

## 2019-04-16 LAB — LIPID PANEL
Chol/HDL Ratio: 3.3 ratio (ref 0.0–5.0)
Cholesterol, Total: 119 mg/dL (ref 100–199)
HDL: 36 mg/dL — ABNORMAL LOW (ref >39)
LDL Calculated: 64 mg/dL (ref 0–99)
Triglycerides: 95 mg/dL (ref 0–149)
VLDL Cholesterol Cal: 19 mg/dL (ref 5–40)

## 2019-08-13 MED ORDER — AMLODIPINE BESYLATE 5 MG PO TABS: 5.0000 mg | ORAL_TABLET | Freq: Every day | ORAL | 1 refills | Status: DC

## 2019-08-13 NOTE — Telephone Encounter (Signed)
Pt's medication was sent to pt's pharmacy as requested. Confirmation received.  °

## 2019-12-25 ENCOUNTER — Other Ambulatory Visit: Payer: Self-pay

## 2019-12-25 MED ORDER — AMLODIPINE BESYLATE 5 MG PO TABS: 5.0000 mg | ORAL_TABLET | Freq: Every day | ORAL | 0 refills | Status: DC

## 2019-12-25 MED ORDER — EZETIMIBE 10 MG PO TABS: 10.0000 mg | ORAL_TABLET | Freq: Every day | ORAL | 0 refills | Status: DC

## 2020-01-29 ENCOUNTER — Other Ambulatory Visit: Payer: Self-pay

## 2020-01-29 ENCOUNTER — Ambulatory Visit (INDEPENDENT_AMBULATORY_CARE_PROVIDER_SITE_OTHER): Payer: Medicare Other | Admitting: Cardiology

## 2020-01-29 ENCOUNTER — Encounter: Payer: Self-pay | Admitting: Cardiology

## 2020-01-29 VITALS — BP 122/76 | HR 60 | Ht 70.0 in | Wt 173.8 lb

## 2020-01-29 DIAGNOSIS — E7800 Pure hypercholesterolemia, unspecified: Secondary | ICD-10-CM

## 2020-01-29 DIAGNOSIS — I10 Essential (primary) hypertension: Secondary | ICD-10-CM

## 2020-01-29 DIAGNOSIS — I2510 Atherosclerotic heart disease of native coronary artery without angina pectoris: Secondary | ICD-10-CM

## 2020-01-29 MED ORDER — ATORVASTATIN CALCIUM 80 MG PO TABS: ORAL_TABLET | ORAL | 3 refills | Status: DC

## 2020-01-29 NOTE — Progress Notes (Signed)
Cardiology Office Note:    Date:  01/29/2020   ID:  [name], DOB 06-11-1951, MRN [id number]  PCP:  Joycelyn Rua, MD  Cardiologist:  No primary care provider on file.    Referring MD: Joycelyn Rua, MD   Chief Complaint  Patient presents with  . Coronary Artery Disease  . Hypertension  . Hyperlipidemia    History of Present Illness:    [name] is a 69 y.o. male with a hx of nonobstructive ASCAD(40-45% OM1 and 25% mLAD by cath in 2012), HTNand dyslipidemia. He is here today for followup and is doing well.  He denies any chest pain or pressure, SOB, DOE, PND, orthopnea, LE edema, dizziness, palpitations or syncope. He is compliant with his meds and is tolerating meds with no SE.    Past Medical History:  Diagnosis Date  . BPV (benign positional vertigo)   . Coronary artery disease    nonobstructive  40-45% OM1 and 25% mid LAD by cath 2012  . DDD (degenerative disc disease), cervical   . GERD (gastroesophageal reflux disease)   . Hyperlipidemia   . Tinnitus     Past Surgical History:  Procedure Laterality Date  . CARDIAC CATHETERIZATION     non-obstructive CAD LVEF 60% 03/24/09, cath 40-45% OM1 and 25% mid LAD in 2012  . vitreal detachment  2012    Current Medications: Current Meds  Medication Sig  . amLODipine (NORVASC) 5 MG tablet Take 1 tablet (5 mg total) by mouth daily. Please schedule your yearly follow up appointment with Dr. Mayford Knife for future refills.  Marland Kitchen aspirin EC 81 MG tablet Take 81 mg by mouth daily.  Marland Kitchen ezetimibe (ZETIA) 10 MG tablet Take 1 tablet (10 mg total) by mouth daily. Please schedule your yearly follow up with Dr. Mayford Knife for future refills.  Marland Kitchen ibuprofen (ADVIL,MOTRIN) 800 MG tablet Take 800 mg by mouth every 8 (eight) hours as needed for mild pain.  Marland Kitchen lisinopril (PRINIVIL,ZESTRIL) 10 MG tablet Take 10 mg by mouth daily.  . [DISCONTINUED] atorvastatin (LIPITOR) 80 MG tablet TAKE 1 TABLET DAILY AT 6 P.M.     Allergies:   Patient has no  known allergies.   Social History   Socioeconomic History  . Marital status: Married    Spouse name: Not on file  . Number of children: Not on file  . Years of education: Not on file  . Highest education level: Not on file  Occupational History  . Not on file  Tobacco Use  . Smoking status: Former Smoker    Quit date: 01/23/1979    Years since quitting: 41.0  . Smokeless tobacco: Never Used  . Tobacco comment: quit in 1980s  Substance and Sexual Activity  . Alcohol use: Yes    Comment: moderate  . Drug use: No  . Sexual activity: Not on file  Other Topics Concern  . Not on file  Social History Narrative  . Not on file   Social Determinants of Health   Financial Resource Strain:   . Difficulty of Paying Living Expenses:   Food Insecurity:   . Worried About Programme researcher, broadcasting/film/video in the Last Year:   . Barista in the Last Year:   Transportation Needs:   . Freight forwarder (Medical):   Marland Kitchen Lack of Transportation (Non-Medical):   Physical Activity:   . Days of Exercise per Week:   . Minutes of Exercise per Session:   Stress:   . Feeling  of Stress :   Social Connections:   . Frequency of Communication with Friends and Family:   . Frequency of Social Gatherings with Friends and Family:   . Attends Religious Services:   . Active Member of Clubs or Organizations:   . Attends Archivist Meetings:   Marland Kitchen Marital Status:      Family History: The patient's family history includes Heart attack in his mother; Heart disease in his mother; Stomach cancer in his father.  ROS:   Please see the history of present illness.    ROS  All other systems reviewed and negative.   EKGs/Labs/Other Studies Reviewed:    The following studies were reviewed today: none  EKG:  EKG is  ordered today.  The ekg ordered today demonstrates NSR with nonspecific ST abnormality  Recent Labs: 04/16/2019: ALT 40   Recent Lipid Panel    Component Value Date/Time   CHOL 119  04/16/2019 0830   TRIG 95 04/16/2019 0830   HDL 36 (L) 04/16/2019 0830   CHOLHDL 3.3 04/16/2019 0830   CHOLHDL 3.5 07/28/2015 0758   VLDL 15 07/28/2015 0758   LDLCALC 64 04/16/2019 0830   LDLDIRECT 93.0 12/24/2014 0739    Physical Exam:    VS:  BP 122/76   Pulse 60   Ht 5\' 10"  (1.778 m)   Wt 173 lb 12.8 oz (78.8 kg)   BMI 24.94 kg/m     Wt Readings from Last 3 Encounters:  01/29/20 173 lb 12.8 oz (78.8 kg)  01/23/19 180 lb (81.6 kg)  01/09/18 181 lb (82.1 kg)     GEN:  Well nourished, well developed in no acute distress HEENT: Normal NECK: No JVD; No carotid bruits LYMPHATICS: No lymphadenopathy CARDIAC: RRR, no murmurs, rubs, gallops RESPIRATORY:  Clear to auscultation without rales, wheezing or rhonchi  ABDOMEN: Soft, non-tender, non-distended MUSCULOSKELETAL:  No edema; No deformity  SKIN: Warm and dry NEUROLOGIC:  Alert and oriented x 3 PSYCHIATRIC:  Normal affect   ASSESSMENT:    1. Coronary artery disease involving native coronary artery of native heart without angina pectoris   2. Benign essential HTN   3. Pure hypercholesterolemia    PLAN:    In order of problems listed above:  1.  ASCAD  - h/o nonobstructive ASCADwith 40-45% OM1 and 25% mLAD by cath in 2012.   -he denies any anginal symptoms -continue on ASA and statin  2.  HTN  -BP is controlled -continue amlodipine 5mg  daily and Lisinopril 20mg  daily -creatinine normal at 0.76 and K+ 4 in Jan 2021  3.  Hyperlipidemia  -His LDL goal is < 70.   -continue atorvastatin 80mg  daily  -he just had FLp and ALT done with his PCP so I will get a copy of the results   Medication Adjustments/Labs and Tests Ordered: Current medicines are reviewed at length with the patient today.  Concerns regarding medicines are outlined above.  Orders Placed This Encounter  Procedures  . EKG 12-Lead   Meds ordered this encounter  Medications  . atorvastatin (LIPITOR) 80 MG tablet    Sig: TAKE 1 TABLET DAILY AT  6 P.M.    Dispense:  90 tablet    Refill:  3    Signed, Fransico Him, MD  01/29/2020 2:37 PM    [place]

## 2020-01-29 NOTE — Patient Instructions (Signed)

## 2020-04-15 ENCOUNTER — Other Ambulatory Visit: Payer: Self-pay | Admitting: Cardiology

## 2020-04-17 ENCOUNTER — Telehealth: Payer: Self-pay | Admitting: *Deleted

## 2020-04-17 NOTE — Telephone Encounter (Signed)
[facility] Ezetimibe (Zetia) 10 mg. Patient was on Atorvastatin 80 mg and it was discontinued per ofc note 01/29/20, but was reinstated in Plan that  Patient will be taking Atorvastatin 80 mg. Also, Ezetimibe 10 mg is on medication list. It was refilled,but  please advise if I need to contact the Pharmacist to have Ezetimibe removed from refill request,if patient is not to continue to take. Thank you

## 2020-04-17 NOTE — Telephone Encounter (Signed)
Okay. Thank you.

## 2020-04-20 NOTE — Telephone Encounter (Signed)
Needs to stay on Zetia 10mg  daily as well as atorvastatin .  Has he been taking the Zetia

## 2021-01-29 ENCOUNTER — Other Ambulatory Visit: Payer: Self-pay | Admitting: Cardiology

## 2021-03-26 ENCOUNTER — Encounter: Payer: Self-pay | Admitting: Cardiology

## 2021-03-26 ENCOUNTER — Ambulatory Visit (INDEPENDENT_AMBULATORY_CARE_PROVIDER_SITE_OTHER): Payer: Medicare Other | Admitting: Cardiology

## 2021-03-26 ENCOUNTER — Other Ambulatory Visit: Payer: Self-pay

## 2021-03-26 VITALS — BP 134/68 | HR 56 | Ht 70.0 in | Wt 175.4 lb

## 2021-03-26 DIAGNOSIS — I2510 Atherosclerotic heart disease of native coronary artery without angina pectoris: Secondary | ICD-10-CM | POA: Diagnosis not present

## 2021-03-26 DIAGNOSIS — I10 Essential (primary) hypertension: Secondary | ICD-10-CM | POA: Diagnosis not present

## 2021-03-26 DIAGNOSIS — E7800 Pure hypercholesterolemia, unspecified: Secondary | ICD-10-CM

## 2021-03-26 LAB — COMPREHENSIVE METABOLIC PANEL
ALT: 24 IU/L (ref 0–44)
AST: 20 IU/L (ref 0–40)
Albumin/Globulin Ratio: 2.5 — ABNORMAL HIGH (ref 1.2–2.2)
Albumin: 4.5 g/dL (ref 3.8–4.8)
Alkaline Phosphatase: 91 IU/L (ref 44–121)
BUN/Creatinine Ratio: 19 (ref 10–24)
BUN: 16 mg/dL (ref 8–27)
Bilirubin Total: 0.7 mg/dL (ref 0.0–1.2)
CO2: 23 mmol/L (ref 20–29)
Calcium: 8.8 mg/dL (ref 8.6–10.2)
Chloride: 104 mmol/L (ref 96–106)
Creatinine, Ser: 0.84 mg/dL (ref 0.76–1.27)
Globulin, Total: 1.8 g/dL (ref 1.5–4.5)
Glucose: 96 mg/dL (ref 65–99)
Potassium: 4.3 mmol/L (ref 3.5–5.2)
Sodium: 141 mmol/L (ref 134–144)
Total Protein: 6.3 g/dL (ref 6.0–8.5)
eGFR: 94 mL/min/{1.73_m2} (ref >59)

## 2021-03-26 LAB — LIPID PANEL
Chol/HDL Ratio: 2.8 ratio (ref 0.0–5.0)
Cholesterol, Total: 121 mg/dL (ref 100–199)
HDL: 43 mg/dL (ref >39)
LDL Chol Calc (NIH): 65 mg/dL (ref 0–99)
Triglycerides: 63 mg/dL (ref 0–149)
VLDL Cholesterol Cal: 13 mg/dL (ref 5–40)

## 2021-03-26 MED ORDER — AMLODIPINE BESYLATE 5 MG PO TABS: 1.0000 | ORAL_TABLET | Freq: Every day | ORAL | 3 refills | Status: DC

## 2021-03-26 MED ORDER — LISINOPRIL 10 MG PO TABS
10.0000 mg | ORAL_TABLET | Freq: Every day | ORAL | 3 refills | Status: DC
Start: 2021-03-26 — End: 2022-02-22

## 2021-03-26 MED ORDER — EZETIMIBE 10 MG PO TABS: 10.0000 mg | ORAL_TABLET | Freq: Every day | ORAL | 3 refills | Status: DC

## 2021-03-26 MED ORDER — ATORVASTATIN CALCIUM 80 MG PO TABS: 80.0000 mg | ORAL_TABLET | Freq: Every day | ORAL | 3 refills | Status: DC

## 2021-03-26 NOTE — Addendum Note (Signed)
Addended by: Theresia Majors on: 03/26/2021 08:41 AM   Modules accepted: Orders

## 2021-03-26 NOTE — Progress Notes (Addendum)
Cardiology Office Note:    Date:  03/26/2021   ID:  [name], DOB [birth year], MRN [id number]  PCP:  Joycelyn Rua, MD  Cardiologist:  None    Referring MD: Joycelyn Rua, MD   Chief Complaint  Patient presents with   Coronary Artery Disease   Hypertension   Hyperlipidemia     History of Present Illness:    [name] is a 70 y.o. male with a hx of nonobstructive ASCAD (40-45% OM1 and 25% mLAD by cath in 2012), HTN and dyslipidemia. He is here today for followup and is doing well.  He denies any chest pain or pressure, SOB, DOE, PND, orthopnea, LE edema, dizziness, palpitations or syncope. He is compliant with his meds and is tolerating meds with no SE.     Past Medical History:  Diagnosis Date   BPV (benign positional vertigo)    Coronary artery disease    nonobstructive  40-45% OM1 and 25% mid LAD by cath 2012   DDD (degenerative disc disease), cervical    GERD (gastroesophageal reflux disease)    Hyperlipidemia    Tinnitus     Past Surgical History:  Procedure Laterality Date   CARDIAC CATHETERIZATION     non-obstructive CAD LVEF 60% 03/24/09, cath 40-45% OM1 and 25% mid LAD in 2012   vitreal detachment  2012    Current Medications: Current Meds  Medication Sig   amLODipine (NORVASC) 5 MG tablet Take 1 tablet (5 mg total) by mouth daily. Patient needs appointment for any future refills. Please call office at [phone] to schedule appointment. 1st attempt.   aspirin EC 81 MG tablet Take 81 mg by mouth daily.   atorvastatin (LIPITOR) 80 MG tablet TAKE 1 TABLET DAILY AT 6 P.M. Patient needs appointment for any future refills. Please call [phone] to schedule appointment. 1st attempt.   ezetimibe (ZETIA) 10 MG tablet Take 1 tablet (10 mg total) by mouth daily. Patient needs appointment for any future refills. Please call office at [phone] to schedule appointment. 1st attempt.   ibuprofen (ADVIL,MOTRIN) 800 MG tablet Take 800 mg by mouth every 8  (eight) hours as needed for mild pain.   lisinopril (PRINIVIL,ZESTRIL) 10 MG tablet Take 10 mg by mouth daily.     Allergies:   Patient has no known allergies.   Social History   Socioeconomic History   Marital status: Married    Spouse name: Not on file   Number of children: Not on file   Years of education: Not on file   Highest education level: Not on file  Occupational History   Not on file  Tobacco Use   Smoking status: Former    Pack years: 0.00    Types: Cigarettes    Quit date: 01/23/1979    Years since quitting: 42.2   Smokeless tobacco: Never   Tobacco comments:    quit in 1980s  Substance and Sexual Activity   Alcohol use: Yes    Comment: moderate   Drug use: No   Sexual activity: Not on file  Other Topics Concern   Not on file  Social History Narrative   Not on file   Social Determinants of Health   Financial Resource Strain: Not on file  Food Insecurity: Not on file  Transportation Needs: Not on file  Physical Activity: Not on file  Stress: Not on file  Social Connections: Not on file     Family History: The patient's family history includes Heart attack in his  mother; Heart disease in his mother; Stomach cancer in his father.  ROS:   Please see the history of present illness.    Review of Systems  Endocrine: Negative for polyphagia.   All other systems reviewed and negative.   EKGs/Labs/Other Studies Reviewed:    The following studies were reviewed today: none  EKG:  EKG is  ordered today.  The ekg ordered today demonstrates Sinus bradycardia with no ST changes  Recent Labs: No results found for requested labs within last 8760 hours.   Recent Lipid Panel    Component Value Date/Time   CHOL 119 04/16/2019 0830   TRIG 95 04/16/2019 0830   HDL 36 (L) 04/16/2019 0830   CHOLHDL 3.3 04/16/2019 0830   CHOLHDL 3.5 07/28/2015 0758   VLDL 15 07/28/2015 0758   LDLCALC 64 04/16/2019 0830   LDLDIRECT 93.0 12/24/2014 0739    Physical Exam:     VS:  BP 134/68   Pulse (!) 56   Ht 5\' 10"  (1.778 m)   Wt 175 lb 6.4 oz (79.6 kg)   SpO2 95%   BMI 25.17 kg/m     Wt Readings from Last 3 Encounters:  03/26/21 175 lb 6.4 oz (79.6 kg)  01/29/20 173 lb 12.8 oz (78.8 kg)  01/23/19 180 lb (81.6 kg)     GEN: Well nourished, well developed in no acute distress HEENT: Normal NECK: No JVD; No carotid bruits LYMPHATICS: No lymphadenopathy CARDIAC:RRR, no murmurs, rubs, gallops RESPIRATORY:  Clear to auscultation without rales, wheezing or rhonchi  ABDOMEN: Soft, non-tender, non-distended MUSCULOSKELETAL:  No edema; No deformity  SKIN: Warm and dry NEUROLOGIC:  Alert and oriented x 3 PSYCHIATRIC:  Normal affect   ASSESSMENT:    1. Coronary artery disease involving native coronary artery of native heart without angina pectoris   2. Benign essential HTN   3. Pure hypercholesterolemia    PLAN:    In order of problems listed above:  1.  ASCAD  -h/o nonobstructive ASCAD with 40-45% OM1 and 25% mLAD by cath in 2012.   -he has not had any anginal symptoms -continue ASA and statin   2.  HTN  -BP is well controlled on exam today -Continue prescription drug management with Amlodipine 5mg  daily and Lisinopril 10mg  daily >>refilled for 1 year - check BMET  3.  Hyperlipidemia  -His LDL goal is < 70.   -Continue prescription drug management with Atorvastatin 80mg  daily  and Zetia 10mg  daily>>refilled for 1 year -check FLP and ALT   Medication Adjustments/Labs and Tests Ordered: Current medicines are reviewed at length with the patient today.  Concerns regarding medicines are outlined above.  Orders Placed This Encounter  Procedures   EKG 12-Lead    No orders of the defined types were placed in this encounter.   Signed, 2013, MD  03/26/2021 8:35 AM    [facility]

## 2021-03-26 NOTE — Patient Instructions (Signed)
Medication Instructions:  Your physician recommends that you continue on your current medications as directed. Please refer to the Current Medication list given to you today.  *If you need a refill on your cardiac medications before your next appointment, please call your pharmacy*   Lab Work: TODAY: CMET, FLP If you have labs (blood work) drawn today and your tests are completely normal, you will receive your results only by: . MyChart Message (if you have MyChart) OR . A paper copy in the mail If you have any lab test that is abnormal or we need to change your treatment, we will call you to review the results.   Follow-Up: At [facility], you and your health needs are our priority.  As part of our continuing mission to provide you with exceptional heart care, we have created designated Provider Care Teams.  These Care Teams include your primary Cardiologist (physician) and Advanced Practice Providers (APPs -  Physician Assistants and Nurse Practitioners) who all work together to provide you with the care you need, when you need it.  Your next appointment:   1 year(s)  The format for your next appointment:   In Person  Provider:   You may see Traci Turner, MD or one of the following Advanced Practice Providers on your designated Care Team:    Dayna Dunn, PA-C  Michele Lenze, PA-C     

## 2022-02-09 ENCOUNTER — Other Ambulatory Visit: Payer: Self-pay | Admitting: Cardiology

## 2022-02-19 ENCOUNTER — Encounter: Payer: Self-pay | Admitting: Cardiology

## 2022-02-22 MED ORDER — LISINOPRIL 10 MG PO TABS: 10.0000 mg | ORAL_TABLET | Freq: Every day | ORAL | 0 refills | Status: AC

## 2022-02-22 MED ORDER — ATORVASTATIN CALCIUM 80 MG PO TABS: 80.0000 mg | ORAL_TABLET | Freq: Every day | ORAL | 0 refills | Status: AC

## 2022-02-22 MED ORDER — EZETIMIBE 10 MG PO TABS: 10.0000 mg | ORAL_TABLET | Freq: Every day | ORAL | 0 refills | Status: AC

## 2022-02-22 MED ORDER — AMLODIPINE BESYLATE 5 MG PO TABS: 5.0000 mg | ORAL_TABLET | Freq: Every day | ORAL | 0 refills | Status: AC

## 2022-05-27 ENCOUNTER — Ambulatory Visit: Payer: Medicare Other | Admitting: Cardiology
